# Patient Record
Sex: Male | Born: 1955 | Race: White | Hispanic: No | Marital: Married | Smoking: Never smoker
Health system: Southern US, Community
[De-identification: ages and names within clinical notes are randomized; demographics above are authoritative.]

## PROBLEM LIST (undated history)

## (undated) DIAGNOSIS — R519 Headache, unspecified: Secondary | ICD-10-CM

## (undated) DIAGNOSIS — D649 Anemia, unspecified: Secondary | ICD-10-CM

## (undated) DIAGNOSIS — G709 Myoneural disorder, unspecified: Secondary | ICD-10-CM

## (undated) HISTORY — PX: KNEE ARTHROPLASTY: SHX992

## (undated) HISTORY — PX: TONSILLECTOMY: SUR1361

## (undated) HISTORY — PX: WISDOM TOOTH EXTRACTION: SHX21

---

## 2007-10-19 ENCOUNTER — Ambulatory Visit: Payer: Self-pay | Admitting: Gastroenterology

## 2015-07-24 ENCOUNTER — Other Ambulatory Visit: Payer: Self-pay | Admitting: Internal Medicine

## 2015-07-24 DIAGNOSIS — N5089 Other specified disorders of the male genital organs: Secondary | ICD-10-CM

## 2015-07-27 ENCOUNTER — Ambulatory Visit
Admission: RE | Admit: 2015-07-27 | Discharge: 2015-07-27 | Disposition: A | Discharge status: Home / Self Care | Payer: 59 | Source: Ambulatory Visit | Attending: Internal Medicine | Admitting: Internal Medicine

## 2015-07-27 DIAGNOSIS — N503 Cyst of epididymis: Secondary | ICD-10-CM | POA: Diagnosis not present

## 2015-07-27 DIAGNOSIS — N5089 Other specified disorders of the male genital organs: Secondary | ICD-10-CM | POA: Diagnosis present

## 2015-07-27 DIAGNOSIS — N433 Hydrocele, unspecified: Secondary | ICD-10-CM | POA: Insufficient documentation

## 2018-03-10 IMAGING — US US ART/VEN ABD/PELV/SCROTUM DOPPLER LTD
1 series · 14 of 25 positions shown · non-contrast
Comparison: No prior.

CLINICAL DATA: Right testicle swelling.

EXAM:
SCROTAL ULTRASOUND
DOPPLER ULTRASOUND OF THE TESTICLES
TECHNIQUE: Complete ultrasound examination of the testicles, epididymis, and
other scrotal structures was performed. Color and spectral Doppler
ultrasound were also utilized to evaluate blood flow to the
testicles.

[Series 1: us art/ven abd/pelv/scrotum doppler ltd · 0.06mm/px · 14 of 93 slices shown]
[im 1/93]
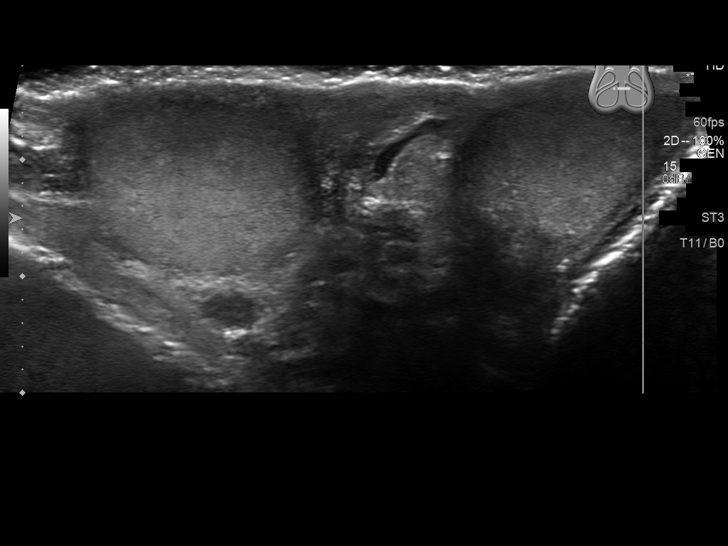
[im 8/93]
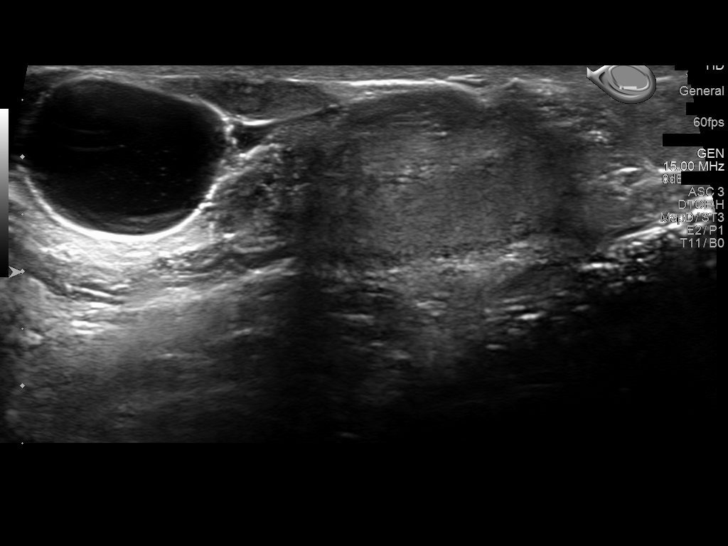
[im 16/93]
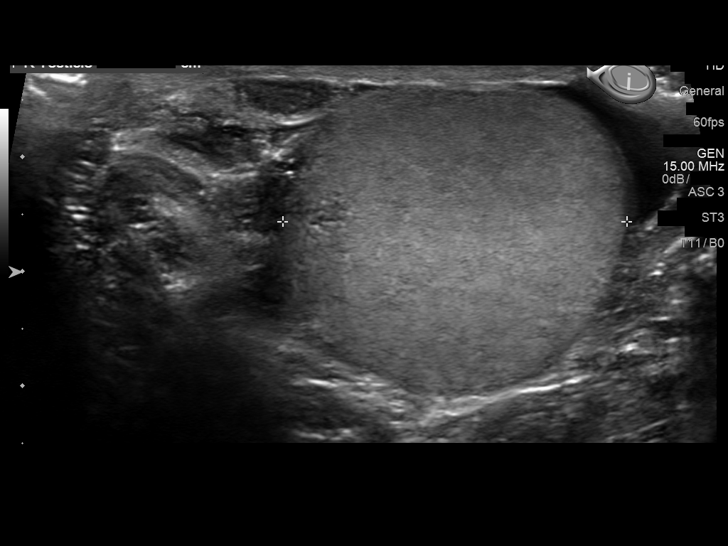
[im 24/93]
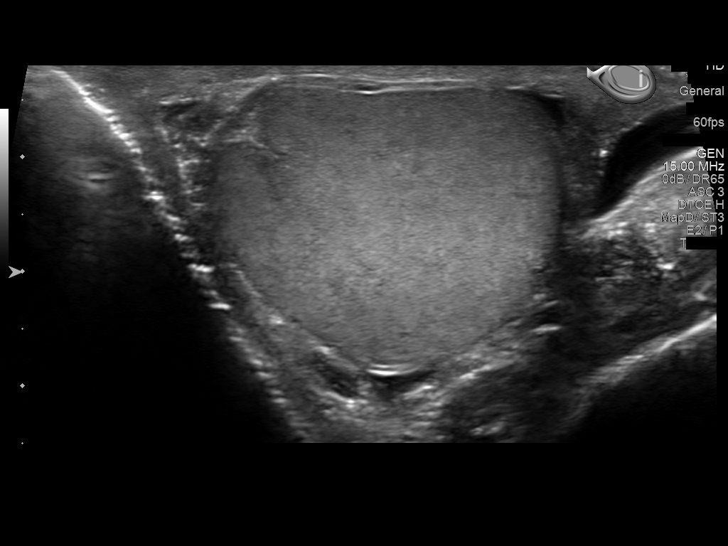
[im 31/93]
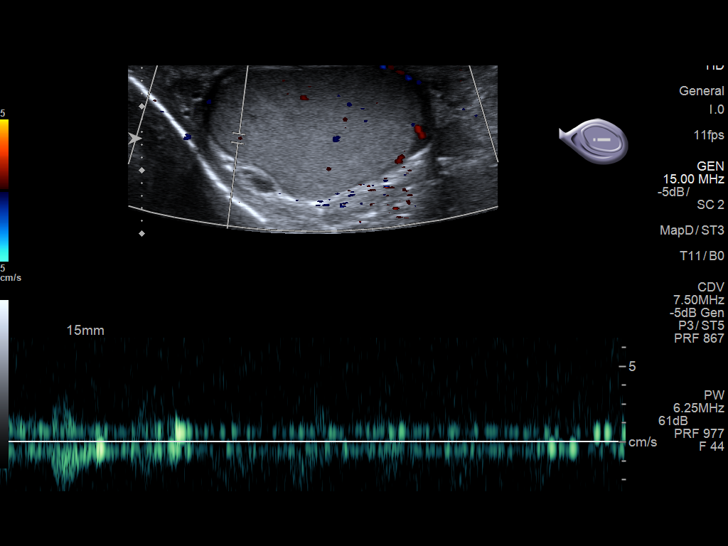
[im 35/93]
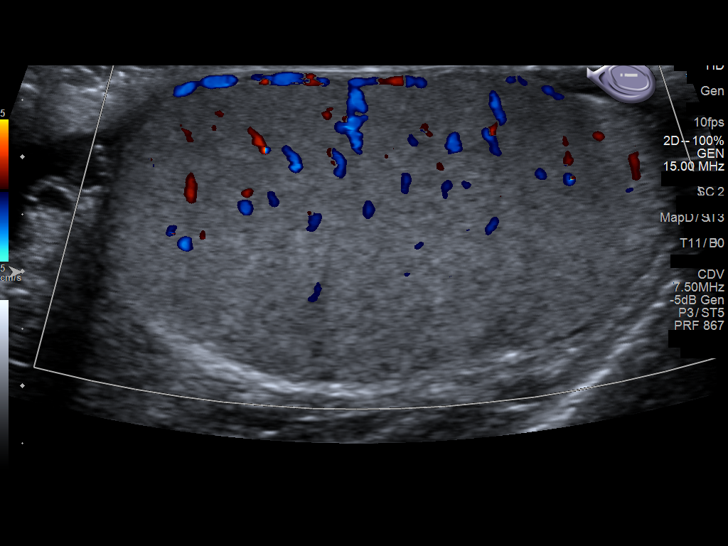
[im 43/93]
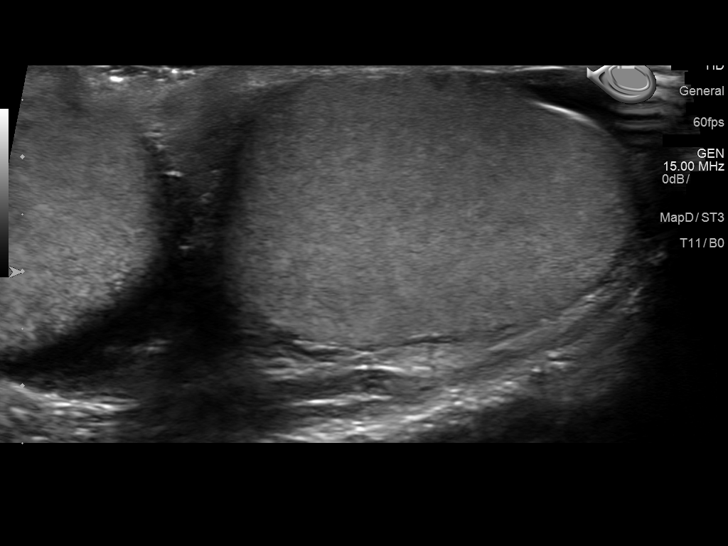
[im 50/93]
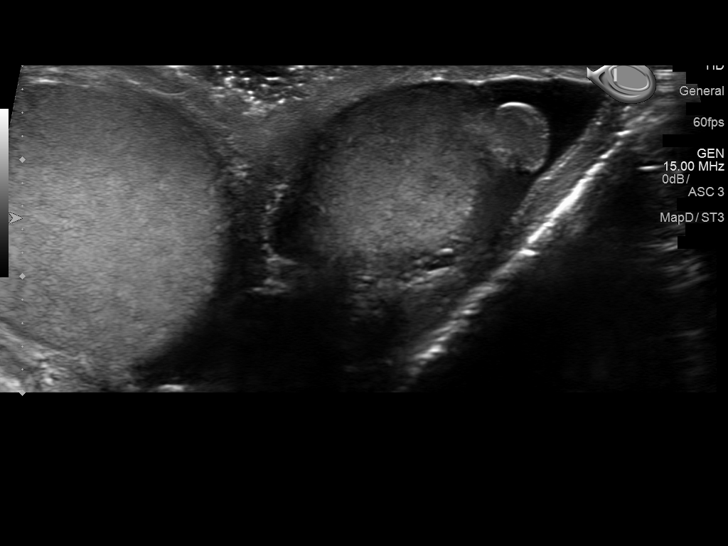
[im 58/93]
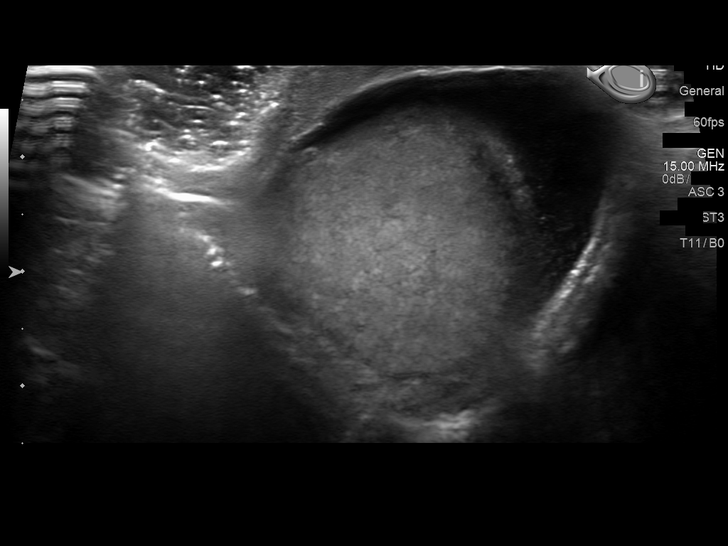
[im 62/93]
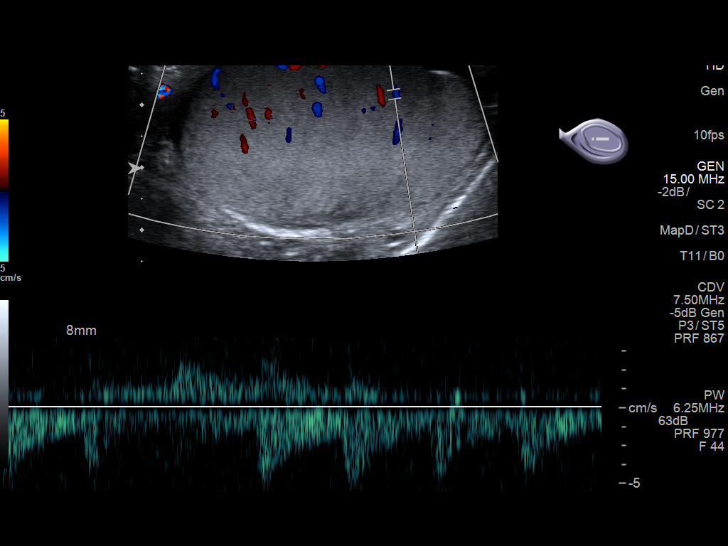
[im 70/93]
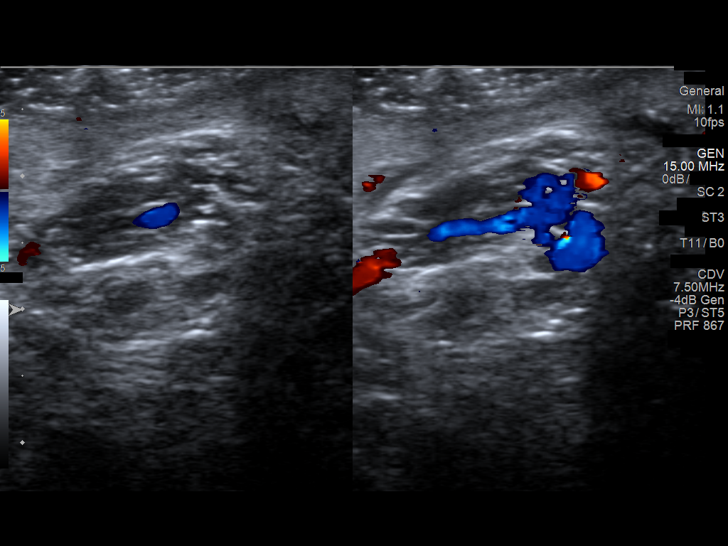
[im 77/93]
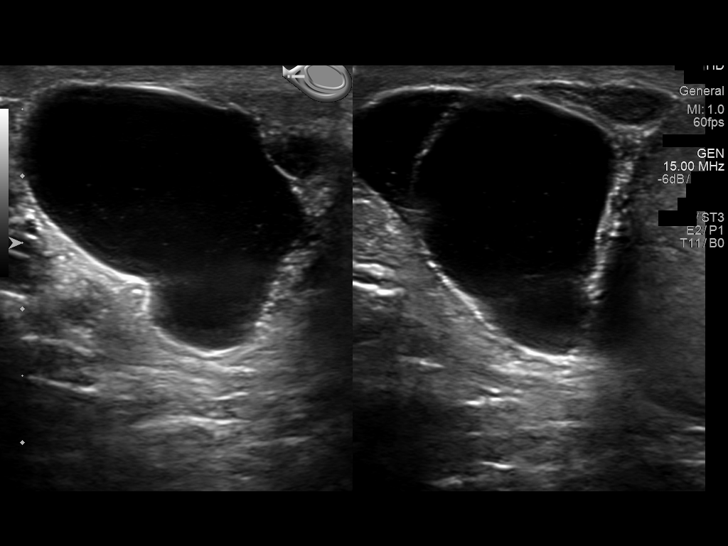
[im 85/93]
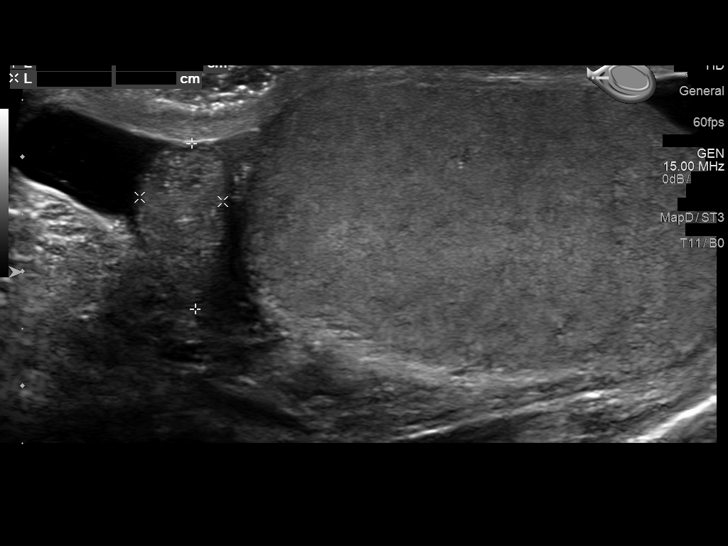
[im 93/93]
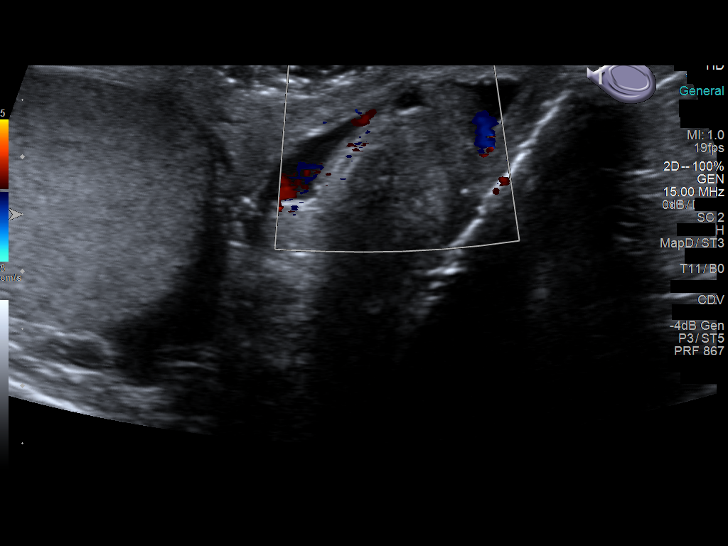

[14 of 25 positions shown; findings below may reference images not displayed]

FINDINGS: Right testicle

Measurements: 5.1 x 2.5 x 3.0 cm. No mass or microlithiasis
visualized.

Left testicle

Measurements: 5.0 x 2.7 x 3.1 cm. No mass or microlithiasis
visualized.

Right epididymis:  2.3 cm cyst.

Left epididymis:  3 mm cyst.

Hydrocele:  Small bilateral hydroceles.

Varicocele:  None visualized.

Pulsed Doppler interrogation of both testes demonstrates normal low
resistance arterial and venous waveforms bilaterally.
IMPRESSION: 2.3 cm right epididymal cyst. 3 mm left epididymal cyst. Small
bilateral hydroceles. Exam otherwise unremarkable.

## 2018-03-24 ENCOUNTER — Encounter
Admission: RE | Disposition: A | Discharge status: Home / Self Care | Payer: Self-pay | Source: Home / Self Care | Attending: Gastroenterology

## 2018-03-24 ENCOUNTER — Ambulatory Visit: Payer: BLUE CROSS/BLUE SHIELD | Admitting: Certified Registered"

## 2018-03-24 ENCOUNTER — Ambulatory Visit
Admission: RE | Admit: 2018-03-24 | Discharge: 2018-03-24 | Disposition: A | Discharge status: Home / Self Care | Payer: BLUE CROSS/BLUE SHIELD | Attending: Gastroenterology | Admitting: Gastroenterology

## 2018-03-24 DIAGNOSIS — Z1211 Encounter for screening for malignant neoplasm of colon: Secondary | ICD-10-CM | POA: Diagnosis not present

## 2018-03-24 DIAGNOSIS — K640 First degree hemorrhoids: Secondary | ICD-10-CM | POA: Insufficient documentation

## 2018-03-24 DIAGNOSIS — K644 Residual hemorrhoidal skin tags: Secondary | ICD-10-CM | POA: Insufficient documentation

## 2018-03-24 DIAGNOSIS — D125 Benign neoplasm of sigmoid colon: Secondary | ICD-10-CM | POA: Diagnosis not present

## 2018-03-24 DIAGNOSIS — Z791 Long term (current) use of non-steroidal anti-inflammatories (NSAID): Secondary | ICD-10-CM | POA: Insufficient documentation

## 2018-03-24 DIAGNOSIS — Z7982 Long term (current) use of aspirin: Secondary | ICD-10-CM | POA: Insufficient documentation

## 2018-03-24 DIAGNOSIS — Z79899 Other long term (current) drug therapy: Secondary | ICD-10-CM | POA: Insufficient documentation

## 2018-03-24 HISTORY — PX: COLONOSCOPY WITH PROPOFOL: SHX5780

## 2018-03-24 SURGERY — COLONOSCOPY WITH PROPOFOL
Anesthesia: General

## 2018-03-24 MED ORDER — PHENYLEPHRINE HCL 10 MG/ML IJ SOLN
INTRAMUSCULAR | Status: DC | PRN
  Administered 2018-03-24: 50 ug via INTRAVENOUS
  Administered 2018-03-24 (×2): 100 ug via INTRAVENOUS

## 2018-03-24 MED ORDER — PROPOFOL 500 MG/50ML IV EMUL
INTRAVENOUS | Status: DC | PRN
  Administered 2018-03-24: 125 ug/kg/min via INTRAVENOUS

## 2018-03-24 MED ORDER — PROPOFOL 10 MG/ML IV BOLUS
INTRAVENOUS | Status: AC
  Filled 2018-03-24: qty 20

## 2018-03-24 MED ORDER — SODIUM CHLORIDE 0.9 % IV SOLN
INTRAVENOUS | Status: DC
  Administered 2018-03-24: 1000 mL via INTRAVENOUS

## 2018-03-24 MED ORDER — PROPOFOL 500 MG/50ML IV EMUL
INTRAVENOUS | Status: AC
  Filled 2018-03-24: qty 50

## 2018-03-24 MED ORDER — PROPOFOL 10 MG/ML IV BOLUS
INTRAVENOUS | Status: DC | PRN
  Administered 2018-03-24: 50 mg via INTRAVENOUS
  Administered 2018-03-24: 30 mg via INTRAVENOUS

## 2018-03-24 MED ORDER — LIDOCAINE HCL (PF) 2 % IJ SOLN
INTRAMUSCULAR | Status: AC
  Filled 2018-03-24: qty 10

## 2018-03-24 MED ORDER — LIDOCAINE HCL (PF) 1 % IJ SOLN
INTRAMUSCULAR | Status: AC
  Administered 2018-03-24: 0.3 mL
  Filled 2018-03-24: qty 2

## 2018-03-24 MED ORDER — EPHEDRINE SULFATE 50 MG/ML IJ SOLN
INTRAMUSCULAR | Status: DC | PRN
  Administered 2018-03-24: 10 mg via INTRAVENOUS
  Administered 2018-03-24: 5 mg via INTRAVENOUS
  Administered 2018-03-24: 10 mg via INTRAVENOUS

## 2018-03-24 MED ORDER — EPHEDRINE SULFATE 50 MG/ML IJ SOLN
INTRAMUSCULAR | Status: AC
  Filled 2018-03-24: qty 1

## 2018-03-24 NOTE — Transfer of Care (Signed)
Immediate Anesthesia Transfer of Care Note  Patient: Jack Duffy  Procedure(s) Performed: COLONOSCOPY WITH PROPOFOL (N/A )  Patient Location: Endoscopy Unit  Anesthesia Type:General  Level of Consciousness: awake  Airway & Oxygen Therapy: Patient Spontanous Breathing and Patient connected to nasal cannula oxygen  Post-op Assessment: Report given to RN and Post -op Vital signs reviewed and stable  Post vital signs: stable  Last Vitals:  Vitals Value Taken Time  BP 108/63 03/24/2018 12:32 PM  Temp    Pulse 80 03/24/2018 12:32 PM  Resp 19 03/24/2018 12:32 PM  SpO2 100 % 03/24/2018 12:32 PM    Last Pain:  Vitals:   03/24/18 1230  TempSrc: (P) Tympanic  PainSc:          Complications: No apparent anesthesia complications

## 2018-03-24 NOTE — Anesthesia Preprocedure Evaluation (Signed)
Anesthesia Evaluation  Patient identified by MRN, date of birth, ID band Patient awake    Reviewed: Allergy & Precautions, NPO status , Patient's Chart, lab work & pertinent test results  History of Anesthesia Complications Negative for: history of anesthetic complications  Airway Mallampati: II  TM Distance: >3 FB Neck ROM: Full    Dental no notable dental hx.    Pulmonary neg pulmonary ROS, neg sleep apnea, neg COPD,    breath sounds clear to auscultation- rhonchi (-) wheezing      Cardiovascular Exercise Tolerance: Good (-) hypertension(-) CAD, (-) Past MI, (-) Cardiac Stents and (-) CABG  Rhythm:Regular Rate:Normal - Systolic murmurs and - Diastolic murmurs    Neuro/Psych neg Seizures negative neurological ROS  negative psych ROS   GI/Hepatic negative GI ROS, Neg liver ROS,   Endo/Other  negative endocrine ROS  Renal/GU negative Renal ROS     Musculoskeletal negative musculoskeletal ROS (+)   Abdominal (+) - obese,   Peds  Hematology negative hematology ROS (+)   Anesthesia Other Findings    Reproductive/Obstetrics                             Anesthesia Physical Anesthesia Plan  ASA: I  Anesthesia Plan: General   Post-op Pain Management:    Induction: Intravenous  PONV Risk Score and Plan: 1 and Propofol infusion  Airway Management Planned: Natural Airway  Additional Equipment:   Intra-op Plan:   Post-operative Plan:   Informed Consent: I have reviewed the patients History and Physical, chart, labs and discussed the procedure including the risks, benefits and alternatives for the proposed anesthesia with the patient or authorized representative who has indicated his/her understanding and acceptance.     Dental advisory given  Plan Discussed with: CRNA and Anesthesiologist  Anesthesia Plan Comments:         Anesthesia Quick Evaluation

## 2018-03-24 NOTE — Anesthesia Post-op Follow-up Note (Signed)
Anesthesia QCDR form completed.        

## 2018-03-24 NOTE — Anesthesia Postprocedure Evaluation (Signed)
Anesthesia Post Note  Patient: Jack Duffy  Procedure(s) Performed: COLONOSCOPY WITH PROPOFOL (N/A )  Patient location during evaluation: Endoscopy Anesthesia Type: General Level of consciousness: awake and alert and oriented Pain management: pain level controlled Vital Signs Assessment: post-procedure vital signs reviewed and stable Respiratory status: spontaneous breathing, nonlabored ventilation and respiratory function stable Cardiovascular status: blood pressure returned to baseline and stable Postop Assessment: no signs of nausea or vomiting Anesthetic complications: no     Last Vitals:  Vitals:   03/24/18 1250 03/24/18 1252  BP: (!) 120/59 (!) 120/59  Pulse: 69 68  Resp: 20 12  Temp:    SpO2: 99% 100%    Last Pain:  Vitals:   03/24/18 1230  TempSrc: Tympanic  PainSc:                  [place] Jeff

## 2018-03-24 NOTE — H&P (Signed)
Outpatient short stay form Pre-procedure 03/24/2018 11:45 AM Lollie Sails MD  Primary Physician: Dr Tracie Harrier  Reason for visit: Colonoscopy  History of present illness: Patient is a 63 year old male presenting today for colonoscopy in regards colon cancer screening.  His last colonoscopy was 10/19/2007.  There were no polyps at that time.  Tolerated his prep well.  He takes no aspirin or blood thinning agent.  Is no problems with abdominal pain diarrhea or blood in the stool.    Current Facility-Administered Medications:  .  0.9 %  sodium chloride infusion, , Intravenous, Continuous, Lollie Sails, MD, Last Rate: 20 mL/hr at 03/24/18 1129, 1,000 mL at 03/24/18 1129  Medications Prior to Admission  Medication Sig Dispense Refill Last Dose  . aspirin EC 81 MG tablet Take 81 mg by mouth daily.   03/19/2018  . gabapentin (NEURONTIN) 300 MG capsule Take 300 mg by mouth at bedtime.     . Multiple Vitamin (MULTIVITAMIN) tablet Take 1 tablet by mouth daily.     . naproxen (NAPROSYN) 500 MG tablet Take 500 mg by mouth 2 (two) times daily with a meal.     . omeprazole (PRILOSEC) 20 MG capsule Take 20 mg by mouth daily.        No Known Allergies   No past medical history on file.  Review of systems:      Physical Exam    Heart and lungs: Regular rate and rhythm without rub or gallop, lungs are bilaterally clear.    HEENT: Normocephalic atraumatic eyes are anicteric    Other:    Pertinant exam for procedure: Soft nontender nondistended bowel sounds positive normoactive    Planned proceedures: Colonoscopy and indicated procedures I have discussed the risks benefits and complications of procedures to include not limited to bleeding, infection, perforation and the risk of sedation and the patient wishes to proceed.    Lollie Sails, MD Gastroenterology 03/24/2018  11:45 AM

## 2018-03-24 NOTE — Op Note (Addendum)
[facility] Patient Name: Jack Duffy Procedure Date: 03/24/2018 11:42 AM MRN: [id number] Account #: [id number] Date of Birth: March 09, 1955 Admit Type: Outpatient Age: 63 Room: [facility] ENDO ROOM 3 Gender: Male Note Status: Finalized Procedure:            Colonoscopy Indications:          Screening for colorectal malignant neoplasm Providers:            Lollie Sails, MD Referring MD:         Tracie Harrier, MD (Referring MD) Medicines:            Monitored Anesthesia Care Complications:        No immediate complications. Procedure:            Pre-Anesthesia Assessment:                       - ASA Grade Assessment: I - A normal, healthy patient.                       After obtaining informed consent, the colonoscope was                        passed under direct vision. Throughout the procedure,                        the patient's blood pressure, pulse, and oxygen                        saturations were monitored continuously. The                        Colonoscope was introduced through the anus and                        advanced to the the cecum, identified by appendiceal                        orifice and ileocecal valve. The colonoscopy was                        performed without difficulty. The patient tolerated the                        procedure well. The quality of the bowel preparation                        was good. Findings:      A 6 mm polyp was found in the distal sigmoid colon. The polyp was       pedunculated. The polyp was removed with a hot snare. Resection and       retrieval were complete.      A diffuse area of mildly erythematous mucosa was found in the distal       rectum. This was biopsied with a cold forceps for histology.      Non-bleeding external and internal hemorrhoids were found during       retroflexion and during anoscopy. The hemorrhoids were medium-sized and       Grade I (internal hemorrhoids that do not  prolapse), with some evidence       of possible recent bleeding.  The exam was otherwise without abnormality.      The digital rectal exam was normal otherwise. Impression:           - One 6 mm polyp in the distal sigmoid colon, removed                        with a hot snare. Resected and retrieved.                       - Erythematous mucosa in the distal rectum. Biopsied.                       - Non-bleeding external and internal hemorrhoids.                       - The examination was otherwise normal. Recommendation:       - Discharge patient to home.                       - Await pathology results. Procedure Code(s):    --- Professional ---                       [phone], Colonoscopy, flexible; with removal of tumor(s),                        polyp(s), or other lesion(s) by snare technique                       45380, 21, Colonoscopy, flexible; with biopsy, single                        or multiple Diagnosis Code(s):    --- Professional ---                       Z12.11, Encounter for screening for malignant neoplasm                        of colon                       D12.5, Benign neoplasm of sigmoid colon                       K62.89, Other specified diseases of anus and rectum                       K64.0, First degree hemorrhoids CPT copyright 2018 American Medical Association. All rights reserved. The codes documented in this report are preliminary and upon coder review may  be revised to meet current compliance requirements. Lollie Sails, MD 03/24/2018 12:31:00 PM This report has been signed electronically. Number of Addenda: 0 Note Initiated On: 03/24/2018 11:42 AM Scope Withdrawal Time: 0 hours 18 minutes 49 seconds  Total Procedure Duration: 0 hours 27 minutes 6 seconds       [facility]

## 2018-03-25 ENCOUNTER — Encounter: Payer: Self-pay | Admitting: Gastroenterology

## 2018-03-25 LAB — SURGICAL PATHOLOGY

## 2019-07-17 ENCOUNTER — Other Ambulatory Visit: Payer: Self-pay

## 2019-07-17 ENCOUNTER — Emergency Department
Admission: EM | Admit: 2019-07-17 | Discharge: 2019-07-17 | Disposition: A | Discharge status: Home / Self Care | Payer: BC Managed Care – PPO | Attending: Emergency Medicine | Admitting: Emergency Medicine

## 2019-07-17 DIAGNOSIS — Z5321 Procedure and treatment not carried out due to patient leaving prior to being seen by health care provider: Secondary | ICD-10-CM | POA: Insufficient documentation

## 2019-07-17 DIAGNOSIS — R197 Diarrhea, unspecified: Secondary | ICD-10-CM | POA: Insufficient documentation

## 2019-07-17 DIAGNOSIS — R112 Nausea with vomiting, unspecified: Secondary | ICD-10-CM | POA: Insufficient documentation

## 2019-07-17 DIAGNOSIS — R109 Unspecified abdominal pain: Secondary | ICD-10-CM | POA: Diagnosis present

## 2019-07-17 LAB — URINALYSIS, COMPLETE (UACMP) WITH MICROSCOPIC
Bacteria, UA: NONE SEEN
Bilirubin Urine: NEGATIVE
Glucose, UA: NEGATIVE mg/dL
Hgb urine dipstick: NEGATIVE
Ketones, ur: NEGATIVE mg/dL
Leukocytes,Ua: NEGATIVE
Nitrite: NEGATIVE
Protein, ur: NEGATIVE mg/dL
Specific Gravity, Urine: 1.017 (ref 1.005–1.030)
Squamous Epithelial / HPF: NONE SEEN (ref 0–5)
pH: 5 (ref 5.0–8.0)

## 2019-07-17 LAB — COMPREHENSIVE METABOLIC PANEL
ALT: 20 U/L (ref 0–44)
AST: 24 U/L (ref 15–41)
Albumin: 4.8 g/dL (ref 3.5–5.0)
Alkaline Phosphatase: 75 U/L (ref 38–126)
Anion gap: 10 (ref 5–15)
BUN: 12 mg/dL (ref 8–23)
CO2: 27 mmol/L (ref 22–32)
Calcium: 9.7 mg/dL (ref 8.9–10.3)
Chloride: 102 mmol/L (ref 98–111)
Creatinine, Ser: 0.98 mg/dL (ref 0.61–1.24)
GFR calc Af Amer: >60 mL/min (ref >60)
GFR calc non Af Amer: >60 mL/min (ref >60)
Glucose, Bld: 106 mg/dL — ABNORMAL HIGH (ref 70–99)
Potassium: 4.4 mmol/L (ref 3.5–5.1)
Sodium: 139 mmol/L (ref 135–145)
Total Bilirubin: 1.2 mg/dL (ref 0.3–1.2)
Total Protein: 7 g/dL (ref 6.5–8.1)

## 2019-07-17 LAB — CBC
HCT: 43.3 % (ref 39.0–52.0)
Hemoglobin: 15.1 g/dL (ref 13.0–17.0)
MCH: 29.3 pg (ref 26.0–34.0)
MCHC: 34.9 g/dL (ref 30.0–36.0)
MCV: 83.9 fL (ref 80.0–100.0)
Platelets: 339 10*3/uL (ref 150–400)
RBC: 5.16 MIL/uL (ref 4.22–5.81)
RDW: 12.8 % (ref 11.5–15.5)
WBC: 11 10*3/uL — ABNORMAL HIGH (ref 4.0–10.5)
nRBC: 0 % (ref 0.0–0.2)

## 2019-07-17 LAB — LIPASE, BLOOD: Lipase: 29 U/L (ref 11–51)

## 2019-07-17 NOTE — ED Triage Notes (Signed)
Patient c/o abdominal pain, N/V/D for several months that has increased in severity today.

## 2019-07-29 ENCOUNTER — Other Ambulatory Visit: Payer: Self-pay | Admitting: Internal Medicine

## 2019-07-29 DIAGNOSIS — R1010 Upper abdominal pain, unspecified: Secondary | ICD-10-CM

## 2019-07-29 DIAGNOSIS — R634 Abnormal weight loss: Secondary | ICD-10-CM

## 2019-08-06 ENCOUNTER — Ambulatory Visit
Admission: RE | Admit: 2019-08-06 | Discharge: 2019-08-06 | Disposition: A | Discharge status: Home / Self Care | Payer: BC Managed Care – PPO | Source: Ambulatory Visit | Attending: Internal Medicine | Admitting: Internal Medicine

## 2019-08-06 ENCOUNTER — Other Ambulatory Visit: Payer: Self-pay

## 2019-08-06 ENCOUNTER — Encounter (INDEPENDENT_AMBULATORY_CARE_PROVIDER_SITE_OTHER): Payer: Self-pay

## 2019-08-06 DIAGNOSIS — R634 Abnormal weight loss: Secondary | ICD-10-CM | POA: Insufficient documentation

## 2019-08-06 DIAGNOSIS — R1010 Upper abdominal pain, unspecified: Secondary | ICD-10-CM | POA: Insufficient documentation

## 2019-08-06 MED ORDER — IOHEXOL 300 MG/ML  SOLN
100.0000 mL | Freq: Once | INTRAMUSCULAR | Status: AC | PRN
  Administered 2019-08-06: 100 mL via INTRAVENOUS

## 2021-01-04 ENCOUNTER — Other Ambulatory Visit: Payer: Self-pay | Admitting: Internal Medicine

## 2021-01-04 DIAGNOSIS — M792 Neuralgia and neuritis, unspecified: Secondary | ICD-10-CM

## 2021-01-04 DIAGNOSIS — G8929 Other chronic pain: Secondary | ICD-10-CM

## 2021-01-16 ENCOUNTER — Ambulatory Visit
Admission: RE | Admit: 2021-01-16 | Discharge: 2021-01-16 | Disposition: A | Discharge status: Home / Self Care | Payer: BC Managed Care – PPO | Source: Ambulatory Visit | Attending: Internal Medicine | Admitting: Internal Medicine

## 2021-01-16 DIAGNOSIS — M792 Neuralgia and neuritis, unspecified: Secondary | ICD-10-CM

## 2021-01-16 DIAGNOSIS — G8929 Other chronic pain: Secondary | ICD-10-CM

## 2021-01-16 MED ORDER — GADOBENATE DIMEGLUMINE 529 MG/ML IV SOLN
15.0000 mL | Freq: Once | INTRAVENOUS | Status: AC | PRN
  Administered 2021-01-16: 15 mL via INTRAVENOUS

## 2021-06-27 ENCOUNTER — Other Ambulatory Visit: Payer: Self-pay | Admitting: Neurosurgery

## 2021-06-27 DIAGNOSIS — Z01818 Encounter for other preprocedural examination: Secondary | ICD-10-CM

## 2021-07-05 ENCOUNTER — Other Ambulatory Visit: Payer: Self-pay

## 2021-07-05 ENCOUNTER — Encounter
Admission: RE | Admit: 2021-07-05 | Discharge: 2021-07-05 | Disposition: A | Discharge status: Home / Self Care | Payer: 59 | Source: Ambulatory Visit | Attending: Neurosurgery | Admitting: Neurosurgery

## 2021-07-05 DIAGNOSIS — Z01818 Encounter for other preprocedural examination: Secondary | ICD-10-CM | POA: Insufficient documentation

## 2021-07-05 DIAGNOSIS — Z01812 Encounter for preprocedural laboratory examination: Secondary | ICD-10-CM

## 2021-07-05 HISTORY — DX: Anemia, unspecified: D64.9

## 2021-07-05 HISTORY — DX: Headache, unspecified: R51.9

## 2021-07-05 HISTORY — DX: Myoneural disorder, unspecified: G70.9

## 2021-07-05 LAB — CBC
HCT: 42.4 % (ref 39.0–52.0)
Hemoglobin: 14.8 g/dL (ref 13.0–17.0)
MCH: 29.2 pg (ref 26.0–34.0)
MCHC: 34.9 g/dL (ref 30.0–36.0)
MCV: 83.8 fL (ref 80.0–100.0)
Platelets: 375 10*3/uL (ref 150–400)
RBC: 5.06 MIL/uL (ref 4.22–5.81)
RDW: 12.6 % (ref 11.5–15.5)
WBC: 6 10*3/uL (ref 4.0–10.5)
nRBC: 0 % (ref 0.0–0.2)

## 2021-07-05 LAB — URINALYSIS, ROUTINE W REFLEX MICROSCOPIC
Bilirubin Urine: NEGATIVE
Glucose, UA: NEGATIVE mg/dL
Hgb urine dipstick: NEGATIVE
Ketones, ur: NEGATIVE mg/dL
Leukocytes,Ua: NEGATIVE
Nitrite: NEGATIVE
Protein, ur: NEGATIVE mg/dL
Specific Gravity, Urine: 1.008 (ref 1.005–1.030)
pH: 7 (ref 5.0–8.0)

## 2021-07-05 LAB — COMPREHENSIVE METABOLIC PANEL
ALT: 22 U/L (ref 0–44)
AST: 26 U/L (ref 15–41)
Albumin: 4.6 g/dL (ref 3.5–5.0)
Alkaline Phosphatase: 77 U/L (ref 38–126)
Anion gap: 8 (ref 5–15)
BUN: 14 mg/dL (ref 8–23)
CO2: 28 mmol/L (ref 22–32)
Calcium: 9.4 mg/dL (ref 8.9–10.3)
Chloride: 105 mmol/L (ref 98–111)
Creatinine, Ser: 0.85 mg/dL (ref 0.61–1.24)
GFR, Estimated: >60 mL/min (ref >60)
Glucose, Bld: 81 mg/dL (ref 70–99)
Potassium: 4.1 mmol/L (ref 3.5–5.1)
Sodium: 141 mmol/L (ref 135–145)
Total Bilirubin: 0.8 mg/dL (ref 0.3–1.2)
Total Protein: 6.8 g/dL (ref 6.5–8.1)

## 2021-07-05 LAB — TYPE AND SCREEN
ABO/RH(D): A POS
Antibody Screen: NEGATIVE

## 2021-07-05 LAB — SURGICAL PCR SCREEN
MRSA, PCR: NEGATIVE
Staphylococcus aureus: NEGATIVE

## 2021-07-05 NOTE — Patient Instructions (Addendum)
Your procedure is scheduled on: 07/16/21 - Monday Report to the Registration Desk on the 1st floor of the [facility]. To find out your arrival time, please call [phone] between 1PM - 3PM on: 07/13/21 - Friday If your arrival time is 6:00 am, do not arrive prior to that time as the [facility] entrance doors do not open until 6:00 am.  REMEMBER: Instructions that are not followed completely may result in serious medical risk, up to and including death; or upon the discretion of your surgeon and anesthesiologist your surgery may need to be rescheduled.  Do not eat food after midnight the night before surgery.  No gum chewing, lozengers or hard candies.  You may however, drink CLEAR liquids up to 2 hours before you are scheduled to arrive for your surgery. Do not drink anything within 2 hours of your scheduled arrival time.  Clear liquids include: - water  - apple juice without pulp - gatorade (not RED colors) - black coffee or tea (Do NOT add milk or creamers to the coffee or tea) Do NOT drink anything that is not on this list.  TAKE THESE MEDICATIONS THE MORNING OF SURGERY WITH A SIP OF WATER:  - omeprazole (PRILOSEC) 20 MG capsule, (take one the night before and one on the morning of surgery - helps to prevent nausea after surgery.)  One week prior to surgery: stop taking 07/09/21. Stop Anti-inflammatories (NSAIDS) such as Advil, Aleve, Ibuprofen, Motrin, Naproxen, Naprosyn and Aspirin based products such as Excedrin, Goodys Powder, BC Powder.  Stop ANY OVER THE COUNTER supplements beginning 07/09/21 until after surgery.Multiple Vitamin , Ascorbic Acid (VITAMIN C) 1000 MG tablet, Cholecalciferol (VITAMIN D3) , Omega-3 Fatty Acids (FISH OIL) 1000 MG CAPS.  You may take Tylenol if needed for pain up until the day of surgery.  No Alcohol for 24 hours before or after surgery.  No Smoking including e-cigarettes for 24 hours prior to surgery.  No chewable tobacco products for at  least 6 hours prior to surgery.  No nicotine patches on the day of surgery.  Do not use any "recreational" drugs for at least a week prior to your surgery.  Please be advised that the combination of cocaine and anesthesia may have negative outcomes, up to and including death. If you test positive for cocaine, your surgery will be cancelled.  On the morning of surgery brush your teeth with toothpaste and water, you may rinse your mouth with mouthwash if you wish. Do not swallow any toothpaste or mouthwash.  Use CHG Soap or wipes as directed on instruction sheet.  Do not wear jewelry, make-up, hairpins, clips or nail polish.  Do not wear lotions, powders, or perfumes.   Do not shave body from the neck down 48 hours prior to surgery just in case you cut yourself which could leave a site for infection.  Also, freshly shaved skin may become irritated if using the CHG soap.  Contact lenses, hearing aids and dentures may not be worn into surgery.  Do not bring valuables to the hospital. [facility] is not responsible for any missing/lost belongings or valuables.   Notify your doctor if there is any change in your medical condition (cold, fever, infection).  Wear comfortable clothing (specific to your surgery type) to the hospital.  After surgery, you can help prevent lung complications by doing breathing exercises.  Take deep breaths and cough every 1-2 hours. Your doctor may order a device called an Incentive Spirometer to help you  take deep breaths. When coughing or sneezing, hold a pillow firmly against your incision with both hands. This is called "splinting." Doing this helps protect your incision. It also decreases belly discomfort.  If you are being admitted to the hospital overnight, leave your suitcase in the car. After surgery it may be brought to your room.  If you are being discharged the day of surgery, you will not be allowed to drive home. You will need a responsible adult  (18 years or older) to drive you home and stay with you that night.   If you are taking public transportation, you will need to have a responsible adult (18 years or older) with you. Please confirm with your physician that it is acceptable to use public transportation.   Please call the [facility] Dept. at [phone] if you have any questions about these instructions.  Surgery Visitation Policy:  Patients undergoing a surgery or procedure may have two family members or support persons with them as long as the person is not COVID-19 positive or experiencing its symptoms.   Inpatient Visitation:    Visiting hours are 7 a.m. to 8 p.m. Up to four visitors are allowed at one time in a patient room, including children. The visitors may rotate out with other people during the day. One designated support person (adult) may remain overnight.

## 2021-07-15 MED ORDER — CHLORHEXIDINE GLUCONATE 0.12 % MT SOLN
15.0000 mL | Freq: Once | OROMUCOSAL | Status: AC
  Administered 2021-07-16: 15 mL via OROMUCOSAL

## 2021-07-15 MED ORDER — ORAL CARE MOUTH RINSE: 15.0000 mL | Freq: Once | OROMUCOSAL | Status: AC

## 2021-07-15 MED ORDER — LACTATED RINGERS IV SOLN: INTRAVENOUS | Status: DC

## 2021-07-16 ENCOUNTER — Ambulatory Visit: Payer: 59

## 2021-07-16 ENCOUNTER — Encounter
Admission: RE | Disposition: A | Discharge status: Home / Self Care | Payer: Self-pay | Source: Home / Self Care | Attending: Neurosurgery

## 2021-07-16 ENCOUNTER — Ambulatory Visit: Payer: 59 | Admitting: Urgent Care

## 2021-07-16 ENCOUNTER — Ambulatory Visit
Admission: RE | Admit: 2021-07-16 | Discharge: 2021-07-16 | Disposition: A | Discharge status: Home / Self Care | Payer: 59 | Attending: Neurosurgery | Admitting: Neurosurgery

## 2021-07-16 ENCOUNTER — Other Ambulatory Visit: Payer: Self-pay

## 2021-07-16 ENCOUNTER — Ambulatory Visit: Payer: 59 | Admitting: Anesthesiology

## 2021-07-16 ENCOUNTER — Encounter: Payer: Self-pay | Admitting: Neurosurgery

## 2021-07-16 DIAGNOSIS — G629 Polyneuropathy, unspecified: Secondary | ICD-10-CM | POA: Insufficient documentation

## 2021-07-16 DIAGNOSIS — M5416 Radiculopathy, lumbar region: Secondary | ICD-10-CM | POA: Insufficient documentation

## 2021-07-16 DIAGNOSIS — D649 Anemia, unspecified: Secondary | ICD-10-CM | POA: Insufficient documentation

## 2021-07-16 DIAGNOSIS — M4807 Spinal stenosis, lumbosacral region: Secondary | ICD-10-CM | POA: Insufficient documentation

## 2021-07-16 DIAGNOSIS — K219 Gastro-esophageal reflux disease without esophagitis: Secondary | ICD-10-CM | POA: Diagnosis not present

## 2021-07-16 HISTORY — PX: LUMBAR LAMINECTOMY/DECOMPRESSION MICRODISCECTOMY: SHX5026

## 2021-07-16 LAB — ABO/RH: ABO/RH(D): A POS

## 2021-07-16 SURGERY — LUMBAR LAMINECTOMY/DECOMPRESSION MICRODISCECTOMY 2 LEVELS
Anesthesia: General | Site: Spine Lumbar | Laterality: Right

## 2021-07-16 MED ORDER — 0.9 % SODIUM CHLORIDE (POUR BTL) OPTIME
TOPICAL | Status: DC | PRN
  Administered 2021-07-16: 500 mL

## 2021-07-16 MED ORDER — OXYCODONE-ACETAMINOPHEN 5-325 MG PO TABS
1.0000 | ORAL_TABLET | Freq: Once | ORAL | Status: AC
  Administered 2021-07-16: 1 via ORAL

## 2021-07-16 MED ORDER — METHYLPREDNISOLONE ACETATE 40 MG/ML IJ SUSP
INTRAMUSCULAR | Status: AC
Start: 2021-07-16 — End: ?
  Filled 2021-07-16: qty 1

## 2021-07-16 MED ORDER — CEFAZOLIN SODIUM-DEXTROSE 2-4 GM/100ML-% IV SOLN
2.0000 g | INTRAVENOUS | Status: AC
  Administered 2021-07-16: 2 g via INTRAVENOUS

## 2021-07-16 MED ORDER — PROPOFOL 10 MG/ML IV BOLUS
INTRAVENOUS | Status: DC | PRN
  Administered 2021-07-16: 160 mg via INTRAVENOUS

## 2021-07-16 MED ORDER — ONDANSETRON HCL 4 MG/2ML IJ SOLN: 4.0000 mg | Freq: Once | INTRAMUSCULAR | Status: DC | PRN

## 2021-07-16 MED ORDER — ONDANSETRON HCL 4 MG/2ML IJ SOLN
INTRAMUSCULAR | Status: AC
  Filled 2021-07-16: qty 2

## 2021-07-16 MED ORDER — BUPIVACAINE HCL (PF) 0.5 % IJ SOLN
INTRAMUSCULAR | Status: AC
Start: 2021-07-16 — End: ?
  Filled 2021-07-16: qty 30

## 2021-07-16 MED ORDER — DEXMEDETOMIDINE (PRECEDEX) IN NS 20 MCG/5ML (4 MCG/ML) IV SYRINGE
PREFILLED_SYRINGE | INTRAVENOUS | Status: DC | PRN
  Administered 2021-07-16: 12 ug via INTRAVENOUS

## 2021-07-16 MED ORDER — LIDOCAINE HCL (CARDIAC) PF 100 MG/5ML IV SOSY
PREFILLED_SYRINGE | INTRAVENOUS | Status: DC | PRN
  Administered 2021-07-16: 100 mg via INTRAVENOUS

## 2021-07-16 MED ORDER — OXYCODONE-ACETAMINOPHEN 5-325 MG PO TABS
ORAL_TABLET | ORAL | Status: AC
  Filled 2021-07-16: qty 1

## 2021-07-16 MED ORDER — SUCCINYLCHOLINE CHLORIDE 200 MG/10ML IV SOSY
PREFILLED_SYRINGE | INTRAVENOUS | Status: DC | PRN
  Administered 2021-07-16: 100 mg via INTRAVENOUS

## 2021-07-16 MED ORDER — DEXAMETHASONE SODIUM PHOSPHATE 10 MG/ML IJ SOLN
INTRAMUSCULAR | Status: AC
  Filled 2021-07-16: qty 1

## 2021-07-16 MED ORDER — MIDAZOLAM HCL 2 MG/2ML IJ SOLN
INTRAMUSCULAR | Status: AC
  Filled 2021-07-16: qty 2

## 2021-07-16 MED ORDER — BUPIVACAINE-EPINEPHRINE (PF) 0.5% -1:200000 IJ SOLN
INTRAMUSCULAR | Status: AC
  Filled 2021-07-16: qty 30

## 2021-07-16 MED ORDER — FENTANYL CITRATE (PF) 100 MCG/2ML IJ SOLN
INTRAMUSCULAR | Status: AC
  Filled 2021-07-16: qty 2

## 2021-07-16 MED ORDER — SUCCINYLCHOLINE CHLORIDE 200 MG/10ML IV SOSY
PREFILLED_SYRINGE | INTRAVENOUS | Status: AC
  Filled 2021-07-16: qty 10

## 2021-07-16 MED ORDER — CHLORHEXIDINE GLUCONATE 0.12 % MT SOLN
OROMUCOSAL | Status: AC
  Filled 2021-07-16: qty 15

## 2021-07-16 MED ORDER — METHYLPREDNISOLONE ACETATE 40 MG/ML IJ SUSP
INTRAMUSCULAR | Status: DC | PRN
  Administered 2021-07-16: 40 mg

## 2021-07-16 MED ORDER — GLYCOPYRROLATE 0.2 MG/ML IJ SOLN
INTRAMUSCULAR | Status: DC | PRN
  Administered 2021-07-16: .2 mg via INTRAVENOUS

## 2021-07-16 MED ORDER — METHOCARBAMOL 500 MG PO TABS: 500.0000 mg | ORAL_TABLET | Freq: Four times a day (QID) | ORAL | 0 refills | Status: DC

## 2021-07-16 MED ORDER — DEXAMETHASONE SODIUM PHOSPHATE 10 MG/ML IJ SOLN
INTRAMUSCULAR | Status: DC | PRN
  Administered 2021-07-16: 10 mg via INTRAVENOUS

## 2021-07-16 MED ORDER — SURGIFLO WITH THROMBIN (HEMOSTATIC MATRIX KIT) OPTIME
TOPICAL | Status: DC | PRN
  Administered 2021-07-16: 1 via TOPICAL

## 2021-07-16 MED ORDER — SODIUM CHLORIDE (PF) 0.9 % IJ SOLN
INTRAMUSCULAR | Status: DC | PRN
  Administered 2021-07-16: 60 mL via INTRAMUSCULAR

## 2021-07-16 MED ORDER — OXYCODONE-ACETAMINOPHEN 5-325 MG PO TABS
1.0000 | ORAL_TABLET | ORAL | 0 refills | Status: AC | PRN
Start: 2021-07-16 — End: 2021-07-21

## 2021-07-16 MED ORDER — BUPIVACAINE LIPOSOME 1.3 % IJ SUSP
INTRAMUSCULAR | Status: AC
Start: 2021-07-16 — End: ?
  Filled 2021-07-16: qty 20

## 2021-07-16 MED ORDER — BUPIVACAINE-EPINEPHRINE (PF) 0.5% -1:200000 IJ SOLN
INTRAMUSCULAR | Status: DC | PRN
  Administered 2021-07-16: 3 mL

## 2021-07-16 MED ORDER — ROCURONIUM BROMIDE 100 MG/10ML IV SOLN
INTRAVENOUS | Status: DC | PRN
  Administered 2021-07-16: 5 mg via INTRAVENOUS

## 2021-07-16 MED ORDER — MIDAZOLAM HCL 2 MG/2ML IJ SOLN
INTRAMUSCULAR | Status: DC | PRN
  Administered 2021-07-16: 2 mg via INTRAVENOUS

## 2021-07-16 MED ORDER — SODIUM CHLORIDE FLUSH 0.9 % IV SOLN
INTRAVENOUS | Status: AC
  Filled 2021-07-16: qty 20

## 2021-07-16 MED ORDER — ONDANSETRON HCL 4 MG/2ML IJ SOLN
INTRAMUSCULAR | Status: DC | PRN
  Administered 2021-07-16: 4 mg via INTRAVENOUS

## 2021-07-16 MED ORDER — FENTANYL CITRATE (PF) 100 MCG/2ML IJ SOLN
INTRAMUSCULAR | Status: DC | PRN
  Administered 2021-07-16 (×2): 50 ug via INTRAVENOUS

## 2021-07-16 MED ORDER — GLYCOPYRROLATE 0.2 MG/ML IJ SOLN
INTRAMUSCULAR | Status: AC
Start: 2021-07-16 — End: ?
  Filled 2021-07-16: qty 1

## 2021-07-16 MED ORDER — LIDOCAINE HCL (PF) 2 % IJ SOLN
INTRAMUSCULAR | Status: AC
Start: 2021-07-16 — End: ?
  Filled 2021-07-16: qty 5

## 2021-07-16 MED ORDER — SENNA 8.6 MG PO TABS
1.0000 | ORAL_TABLET | Freq: Every day | ORAL | 0 refills | Status: DC | PRN
Start: 2021-07-16 — End: 2021-07-31

## 2021-07-16 MED ORDER — CEFAZOLIN SODIUM-DEXTROSE 2-4 GM/100ML-% IV SOLN
INTRAVENOUS | Status: AC
  Filled 2021-07-16: qty 100

## 2021-07-16 MED ORDER — PROPOFOL 10 MG/ML IV BOLUS
INTRAVENOUS | Status: AC
  Filled 2021-07-16: qty 40

## 2021-07-16 MED ORDER — FENTANYL CITRATE (PF) 100 MCG/2ML IJ SOLN: 25.0000 ug | INTRAMUSCULAR | Status: DC | PRN

## 2021-07-16 MED ORDER — ROCURONIUM BROMIDE 10 MG/ML (PF) SYRINGE
PREFILLED_SYRINGE | INTRAVENOUS | Status: AC
  Filled 2021-07-16: qty 10

## 2021-07-16 SURGICAL SUPPLY — 61 items
ADH SKN CLS APL DERMABOND .7 (GAUZE/BANDAGES/DRESSINGS) ×1
AGENT HMST KT MTR STRL THRMB (HEMOSTASIS) ×1
BASIN KIT SINGLE STR (MISCELLANEOUS) ×2 IMPLANT
BLADE CLIPPER SPEC (BLADE) ×1 IMPLANT
BUR NEURO DRILL SOFT 3.0X3.8M (BURR) ×2 IMPLANT
CNTNR SPEC 2.5X3XGRAD LEK (MISCELLANEOUS) ×1
CONT SPEC 4OZ STER OR WHT (MISCELLANEOUS) ×1
CONT SPEC 4OZ STRL OR WHT (MISCELLANEOUS) ×1
CONTAINER SPEC 2.5X3XGRAD LEK (MISCELLANEOUS) ×1 IMPLANT
COUNTER NEEDLE 20/40 LG (NEEDLE) ×1 IMPLANT
CUP MEDICINE 2OZ PLAST GRAD ST (MISCELLANEOUS) ×1 IMPLANT
DERMABOND ADVANCED (GAUZE/BANDAGES/DRESSINGS) ×1
DERMABOND ADVANCED .7 DNX12 (GAUZE/BANDAGES/DRESSINGS) ×1 IMPLANT
DRAPE C ARM PK CFD 31 SPINE (DRAPES) ×2 IMPLANT
DRAPE C-ARM 42X72 X-RAY (DRAPES) ×1 IMPLANT
DRAPE LAPAROTOMY 100X77 ABD (DRAPES) ×2 IMPLANT
DRAPE MICROSCOPE SPINE 48X150 (DRAPES) ×2 IMPLANT
DRAPE SURG 17X11 SM STRL (DRAPES) ×2 IMPLANT
DRSG OPSITE POSTOP 4X6 (GAUZE/BANDAGES/DRESSINGS) ×1 IMPLANT
ELECT CAUTERY BLADE TIP 2.5 (TIP)
ELECT EZSTD 165MM 6.5IN (MISCELLANEOUS) ×2
ELECT REM PT RETURN 9FT ADLT (ELECTROSURGICAL) ×2
ELECTRODE CAUTERY BLDE TIP 2.5 (TIP) ×1 IMPLANT
ELECTRODE EZSTD 165MM 6.5IN (MISCELLANEOUS) ×1 IMPLANT
ELECTRODE REM PT RTRN 9FT ADLT (ELECTROSURGICAL) ×1 IMPLANT
GLOVE BIOGEL PI IND STRL 6.5 (GLOVE) ×1 IMPLANT
GLOVE BIOGEL PI IND STRL 8.5 (GLOVE) ×1 IMPLANT
GLOVE BIOGEL PI INDICATOR 6.5 (GLOVE) ×1
GLOVE BIOGEL PI INDICATOR 8.5 (GLOVE) ×1
GLOVE SURG SYN 6.5 ES PF (GLOVE) ×2 IMPLANT
GLOVE SURG SYN 6.5 PF PI (GLOVE) ×1 IMPLANT
GLOVE SURG SYN 8.5  E (GLOVE) ×3
GLOVE SURG SYN 8.5 E (GLOVE) ×3 IMPLANT
GLOVE SURG SYN 8.5 PF PI (GLOVE) ×3 IMPLANT
GOWN SRG LRG LVL 4 IMPRV REINF (GOWNS) ×1 IMPLANT
GOWN SRG XL LVL 3 NONREINFORCE (GOWNS) ×1 IMPLANT
GOWN STRL NON-REIN TWL XL LVL3 (GOWNS) ×2
GOWN STRL REIN LRG LVL4 (GOWNS) ×2
GRADUATE 1200CC STRL 31836 (MISCELLANEOUS) ×1 IMPLANT
GRAFT DURAGEN MATRIX 1WX1L (Tissue) ×1 IMPLANT
KIT SPINAL PRONEVIEW (KITS) ×2 IMPLANT
MANIFOLD NEPTUNE II (INSTRUMENTS) ×2 IMPLANT
MARKER SKIN DUAL TIP RULER LAB (MISCELLANEOUS) ×3 IMPLANT
NDL HYPO 25X1 1.5 SAFETY (NEEDLE) IMPLANT
NDL SAFETY ECLIPSE 18X1.5 (NEEDLE) ×1 IMPLANT
NEEDLE HYPO 18GX1.5 SHARP (NEEDLE)
NEEDLE HYPO 25X1 1.5 SAFETY (NEEDLE) ×2 IMPLANT
NS IRRIG 500ML POUR BTL (IV SOLUTION) ×1 IMPLANT
PACK LAMINECTOMY NEURO (CUSTOM PROCEDURE TRAY) ×2 IMPLANT
PENCIL ELECTRO HAND CTR (MISCELLANEOUS) ×4 IMPLANT
SOLUTION IRRIG SURGIPHOR (IV SOLUTION) ×2 IMPLANT
SURGIFLO W/THROMBIN 8M KIT (HEMOSTASIS) ×2 IMPLANT
SUT DVC VLOC 3-0 CL 6 P-12 (SUTURE) ×2 IMPLANT
SUT VIC AB 0 CT1 27 (SUTURE) ×2
SUT VIC AB 0 CT1 27XCR 8 STRN (SUTURE) ×1 IMPLANT
SUT VIC AB 2-0 CT1 18 (SUTURE) ×2 IMPLANT
SYR 30ML LL (SYRINGE) ×4 IMPLANT
SYR 3ML LL SCALE MARK (SYRINGE) ×3 IMPLANT
TOWEL OR 17X26 4PK STRL BLUE (TOWEL DISPOSABLE) ×4 IMPLANT
TUBING CONNECTING 10 (TUBING) ×1 IMPLANT
WATER STERILE IRR 1000ML POUR (IV SOLUTION) ×5 IMPLANT

## 2021-07-16 NOTE — Progress Notes (Signed)
PAtient able to ambulate to and from the restroom without any issues

## 2021-07-16 NOTE — Anesthesia Preprocedure Evaluation (Signed)
Anesthesia Evaluation  Patient identified by MRN, date of birth, ID band Patient awake    Reviewed: Allergy & Precautions, H&P , NPO status , Patient's Chart, lab work & pertinent test results, reviewed documented beta blocker date and time   Airway Mallampati: II  TM Distance: >3 FB Neck ROM: full    Dental  (+) Teeth Intact   Pulmonary neg pulmonary ROS,    Pulmonary exam normal        Cardiovascular Exercise Tolerance: Good negative cardio ROS Normal cardiovascular exam Rhythm:regular Rate:Normal     Neuro/Psych  Headaches,  Neuromuscular disease negative psych ROS   GI/Hepatic negative GI ROS, Neg liver ROS,   Endo/Other  negative endocrine ROS  Renal/GU negative Renal ROS  negative genitourinary   Musculoskeletal   Abdominal   Peds  Hematology  (+) Blood dyscrasia, anemia ,   Anesthesia Other Findings Past Medical History: No date: Anemia No date: Headache     Comment:  visual distortion migraines No date: Neuromuscular disorder (HCC)     Comment:  neuropathy Past Surgical History: 03/24/2018: COLONOSCOPY WITH PROPOFOL; N/A     Comment:  Procedure: COLONOSCOPY WITH PROPOFOL;  Surgeon:               Lollie Sails, MD;  Location: [facility] ENDOSCOPY;                Service: Endoscopy;  Laterality: N/A; No date: KNEE ARTHROPLASTY     Comment:  when in college, was not a replacement No date: TONSILLECTOMY No date: WISDOM TOOTH EXTRACTION BMI    Body Mass Index: 25.09 kg/m     Reproductive/Obstetrics negative OB ROS                             Anesthesia Physical Anesthesia Plan  ASA: 2  Anesthesia Plan: General ETT   Post-op Pain Management:    Induction:   PONV Risk Score and Plan: 3  Airway Management Planned:   Additional Equipment:   Intra-op Plan:   Post-operative Plan:   Informed Consent: I have reviewed the patients History and Physical, chart, labs and  discussed the procedure including the risks, benefits and alternatives for the proposed anesthesia with the patient or authorized representative who has indicated his/her understanding and acceptance.     Dental Advisory Given  Plan Discussed with: CRNA  Anesthesia Plan Comments:         Anesthesia Quick Evaluation

## 2021-07-16 NOTE — Op Note (Addendum)
Indications: Jack Duffy is a 66 yo male who presents with lumbar radiculopathy.  He failed conservative management prompting surgical intervention.  Findings: compression of nerve roots  Preoperative Diagnosis: m54.16 lumbar radiculopathy Postoperative Diagnosis: same   EBL: 25 ml IVF: 750 ml Drains: none Disposition: Extubated and Stable to PACU Complications: none  No foley catheter was placed.   Preoperative Note:   Risks of surgery discussed include: infection, bleeding, stroke, coma, death, paralysis, CSF leak, nerve/spinal cord injury, numbness, tingling, weakness, complex regional pain syndrome, recurrent stenosis and/or disc herniation, vascular injury, development of instability, neck/back pain, need for further surgery, persistent symptoms, development of deformity, and the risks of anesthesia. The patient understood these risks and agreed to proceed.  Operative Note:   1. L4-S1 lumbar decompression including central laminectomy and bilateral medial facetectomies including foraminotomies  The patient was then brought from the preoperative center with intravenous access established.  The patient underwent general anesthesia and endotracheal tube intubation, and was then rotated on the [place] rail top where all pressure points were appropriately padded.  The skin was then thoroughly cleansed.  Perioperative antibiotic prophylaxis was administered.  Sterile prep and drapes were then applied and a timeout was then observed.  C-arm was brought into the field under sterile conditions and under lateral visualization the L5-S1 interspace was identified and marked.  The incision was marked on the right and injected with local anesthetic. Once this was complete a 4 cm incision was opened with the use of a #10 blade knife.    The metrx tubes were sequentially advanced and confirmed in position at L5-S1. An 2m by 421mtube was locked in place to the bed side attachment.  The microscope  was then sterilely brought into the field and muscle creep was hemostased with a bipolar and resected with a pituitary rongeur.  A Bovie extender was then used to expose the spinous process and lamina.  Careful attention was placed to not violate the facet capsule. A 3 mm matchstick drill bit was then used to make a hemi-laminotomy trough until the ligamentum flavum was exposed.  This was extended to the base of the spinous process and to the contralateral side to remove all the central bone from each side.  Once this was complete and the underlying ligamentum flavum was visualized, it was dissected with a curette and resected with Kerrison rongeurs.  Extensive ligamentum hypertrophy was noted, requiring a substantial amount of time and care for removal.  The dura was identified and palpated. The kerrison rongeur was then used to remove the medial facet bilaterally until no compression was noted.  A balltip probe was used to confirm decompression of the ipsilateral S1 nerve root.  No CSF leak was noted.  A Depo-Medrol soaked Gelfoam pledget was placed in the defect.  The wound was copiously irrigated. The tube system was then removed under microscopic visualization and hemostasis was obtained with a bipolar.    After performing the decompression at L5-S1, the metrx tubes were sequentially advanced and confirmed in position at L4-5. An 1844my 1m4mbe was locked in place to the bed side attachment.  Fluoroscopy was then removed from the field.  The microscope was then sterilely brought into the field and muscle creep was hemostased with a bipolar and resected with a pituitary rongeur.  A Bovie extender was then used to expose the spinous process and lamina.  Careful attention was placed to not violate the facet capsule. A 3 mm matchstick drill bit  was then used to make a hemi-laminotomy trough until the ligamentum flavum was exposed.  This was extended to the base of the spinous process and to the  contralateral side to remove all the central bone from each side.  Once this was complete and the underlying ligamentum flavum was visualized, it was dissected with a curette and resected with Kerrison rongeurs.  Extensive ligamentum hypertrophy was noted, requiring a substantial amount of time and care for removal.  The dura was identified and palpated. The kerrison rongeur was then used to remove the medial facet bilaterally until no compression was noted.  A balltip probe was used to confirm decompression of the ipsilateral L5 nerve root.  No CSF leak was noted.  A Depo-Medrol soaked Gelfoam pledget was placed in the defect.  The wound was copiously irrigated. The tube system was then removed under microscopic visualization and hemostasis was obtained with a bipolar.     The fascial layer was reapproximated with the use of a 0 Vicryl suture.  Subcutaneous tissue layer was reapproximated using 2-0 Vicryl suture.  3-0 monocryl was placed in subcuticular fashion. The skin was then cleansed and Dermabond was used to close the skin opening.  Patient was then rotated back to the preoperative bed awakened from anesthesia and taken to recovery all counts are correct in this case.  I performed the entire procedure with the assistance of Cooper Render PA as an Pensions consultant. An Pensions consultant was required for this case for safe and careful dissection of the soft tissues while manipulating and decompressing the neural elements.  Yvette Loveless K. Izora Ribas MD

## 2021-07-16 NOTE — Discharge Summary (Signed)
Physician Discharge Summary  Patient ID: Jack Duffy MRN: [id number] DOB/AGE: 64-11-1955 64 y.o.  Admit date: 07/16/2021 Discharge date: 07/16/2021  Admission Diagnoses: m54.16 lumbar radiculopathy  Discharge Diagnoses:  Active Problems:   * No active hospital problems. *   Discharged Condition: good  Hospital Course:  Jack Duffy is a 66 y.o s/p L4-S1 laminoforaminotomies. Jack Duffy intraoperative course was uncomplicated. He was monitored post-op in the PACU and discharged home after ambulating, urinating, and tolerating PO intake. He was given prescriptions for Percocet, Robaxin, and Senna  Consults: None  Significant Diagnostic Studies: none   Treatments: surgery: as above. Please see separately dictated operative report for further details.   Discharge Exam: Blood pressure 132/66, pulse 64, temperature 99 F (37.2 C), temperature source Oral, resp. rate 18, height 6' (1.829 m), weight 83.9 kg, SpO2 100 %. CN II-XII grossly intact 5/5 throughout BLE Incision c/d/i  Disposition: Discharge disposition: 01-Home or Self Care       Discharge Instructions     Remove dressing in 48 hours   Complete by: As directed       Allergies as of 07/16/2021       Reactions   Lamisil [terbinafine] Rash        Medication List     STOP taking these medications    acetaminophen 500 MG tablet Commonly known as: TYLENOL   aspirin EC 81 MG tablet   naproxen 500 MG tablet Commonly known as: NAPROSYN       TAKE these medications    clobetasol 0.05 % Gel Commonly known as: TEMOVATE Apply 1 Application topically as needed.   ferrous sulfate 325 (65 FE) MG tablet Take 325 mg by mouth daily with breakfast.   Fish Oil 1000 MG Caps Take 1 capsule by mouth daily.   gabapentin 300 MG capsule Commonly known as: NEURONTIN Take 300 mg by mouth at bedtime.   methocarbamol 500 MG tablet Commonly known as: ROBAXIN Take 1 tablet (500 mg total) by mouth 4 (four) times  daily.   multivitamin tablet Take 1 tablet by mouth daily.   omeprazole 20 MG capsule Commonly known as: PRILOSEC Take 20 mg by mouth daily.   oxyCODONE-acetaminophen 5-325 MG tablet Commonly known as: Percocet Take 1 tablet by mouth every 4 (four) hours as needed for up to 5 days for severe pain.   senna 8.6 MG Tabs tablet Commonly known as: SENOKOT Take 1 tablet (8.6 mg total) by mouth daily as needed for mild constipation.   vitamin C 1000 MG tablet Take 1,000 mg by mouth daily.   Vitamin D3 50 MCG (2000 UT) Tabs Take 1 tablet by mouth 3 (three) times a week.        Follow-up Information     Loleta Dicker, PA Follow up in 2 week(s).   Specialty: Neurosurgery Why: post-op follow up Contact information: [address] [place] [address] [phone]                 Signed: Loleta Dicker 07/16/2021, 9:21 AM

## 2021-07-16 NOTE — Discharge Instructions (Addendum)
Your surgeon has performed an operation on your lumbar spine (low back) to relieve pressure on one or more nerves. Many times, patients feel better immediately after surgery and can "overdo it." Even if you feel well, it is important that you follow these activity guidelines. If you do not let your back heal properly from the surgery, you can increase the chance of a disc herniation and/or return of your symptoms. The following are instructions to help in your recovery once you have been discharged from the hospital.   Activity    No bending, lifting, or twisting ("BLT"). Avoid lifting objects heavier than 10 pounds (gallon milk jug).  Where possible, avoid household activities that involve lifting, bending, pushing, or pulling such as laundry, vacuuming, grocery shopping, and childcare. Try to arrange for help from friends and family for these activities while your back heals.  Increase physical activity slowly as tolerated.  Taking short walks is encouraged, but avoid strenuous exercise. Do not jog, run, bicycle, lift weights, or participate in any other exercises unless specifically allowed by your doctor. Avoid prolonged sitting, including car rides.  Talk to your doctor before resuming sexual activity.  You should not drive until cleared by your doctor.  Until released by your doctor, you should not return to work or school.  You should rest at home and let your body heal.   You may shower two days after your surgery.  After showering, lightly dab your incision dry. Do not take a tub bath or go swimming for 3 weeks, or until approved by your doctor at your follow-up appointment.  If you smoke, we strongly recommend that you quit.  Smoking has been proven to interfere with normal healing in your back and will dramatically reduce the success rate of your surgery. Please contact [facility] (800-QUIT-NOW) and use the resources at www.[facility].com for assistance in stopping smoking.  Surgical  Incision   If you have a dressing on your incision, you may remove it two days after your surgery. Keep your incision area clean and dry.  Your incision was closed with Dermabond glue. The glue should begin to peel away within about a week. Diet            You may return to your usual diet. Be sure to stay hydrated.  When to Contact us  Although your surgery and recovery will likely be uneventful, you may have some residual numbness, aches, and pains in your back and/or legs. This is normal and should improve in the next few weeks.  However, should you experience any of the following, contact us immediately: New numbness or weakness Pain that is progressively getting worse, and is not relieved by your pain medications or rest Bleeding, redness, swelling, pain, or drainage from surgical incision Chills or flu-like symptoms Fever greater than 101.0 F (38.3 C) Problems with bowel or bladder functions Difficulty breathing or shortness of breath Warmth, tenderness, or swelling in your calf  Contact Information During office hours (Monday-Friday 9 am to 5 pm), please call your physician at [phone] After hours and weekends, please call [phone] and speak with the answering service, who will contact the doctor on call.  If that fails, call the [facility] Operator at [phone] and ask for the Neurosurgery Resident On Call  For a life-threatening emergency, call [place]   The drugs that you were given will stay in your system until tomorrow so for the next 24 hours you should not:  Drive an automobile Make any legal decisions Drink any alcoholic beverage   You may resume regular meals tomorrow.  Today it is better to start with liquids and gradually work up to solid foods.  You may eat anything you prefer, but it is better to start with liquids, then soup and crackers, and gradually work up to solid foods.   Please notify your doctor  immediately if you have any unusual bleeding, trouble breathing, redness and pain at the surgery site, drainage, fever, or pain not relieved by medication.    Additional Instructions:   PLEASE LEAVE GREEN ARMBAND ON FOR 4 DAYS    Please contact your physician with any problems or Same Day Surgery at [phone], Monday through Friday 6 am to 4 pm, or [place] [facility] number at [phone].

## 2021-07-16 NOTE — Anesthesia Postprocedure Evaluation (Signed)
Anesthesia Post Note  Patient: Jack Duffy  Procedure(s) Performed: RIGHT L4-S1 LAMINOFORAMINOTOMIES (Right: Spine Lumbar)  Patient location during evaluation: PACU Anesthesia Type: General Level of consciousness: awake and alert, oriented and patient cooperative Pain management: pain level controlled Vital Signs Assessment: post-procedure vital signs reviewed and stable Respiratory status: spontaneous breathing, nonlabored ventilation and respiratory function stable Cardiovascular status: blood pressure returned to baseline and stable Postop Assessment: adequate PO intake Anesthetic complications: no   No notable events documented.   Last Vitals:  Vitals:   07/16/21 1030 07/16/21 1045  BP: 131/76 129/75  Pulse: 67 72  Resp: 15 20  Temp: (!) 36.3 C (!) 36.1 C  SpO2: 96% 100%    Last Pain:  Vitals:   07/16/21 1045  TempSrc: Temporal  PainSc: 2                  Darrin Nipper

## 2021-07-16 NOTE — Progress Notes (Signed)
Pharmacy Antibiotic Note  Jack Duffy is a 66 y.o. male admitted on 07/16/2021 with surgical prophylaxis.  Pharmacy has been consulted for Cefazolin dosing.  Plan: TBW = 83.9 kg  Cefazolin 2 gm IV X 1 60 min pre-op ordered for 7/10 @ 0630.  Height: 6' (182.9 cm) Weight: 83.9 kg (185 lb) IBW/kg (Calculated) : 77.6  No data recorded.  No results for input(s): "WBC", "CREATININE", "LATICACIDVEN", "VANCOTROUGH", "VANCOPEAK", "VANCORANDOM", "GENTTROUGH", "GENTPEAK", "GENTRANDOM", "TOBRATROUGH", "TOBRAPEAK", "TOBRARND", "AMIKACINPEAK", "AMIKACINTROU", "AMIKACIN" in the last 168 hours.  Estimated Creatinine Clearance: 96.4 mL/min (by C-G formula based on SCr of 0.85 mg/dL).    Allergies  Allergen Reactions   Lamisil [Terbinafine] Rash    Antimicrobials this admission:   >>    >>   Dose adjustments this admission:   Microbiology results:  BCx:   UCx:    Sputum:    MRSA PCR:   Thank you for allowing pharmacy to be a part of this patient's care.  Saidee Geremia D 07/16/2021 6:26 AM

## 2021-07-16 NOTE — H&P (Signed)
I have reviewed and confirmed my history and physical from 06/26/2021 with no additions or changes. Plan for R L4-S1 laminoforaminotomies.  Risks and benefits reviewed.  Heart sounds normal no MRG. Chest Clear to Auscultation Bilaterally.

## 2021-07-16 NOTE — Transfer of Care (Signed)
Immediate Anesthesia Transfer of Care Note  Patient: Jack Duffy  Procedure(s) Performed: RIGHT L4-S1 LAMINOFORAMINOTOMIES (Right: Spine Lumbar)  Patient Location: PACU  Anesthesia Type:General  Level of Consciousness: sedated  Airway & Oxygen Therapy: Patient Spontanous Breathing and Patient connected to face mask oxygen  Post-op Assessment: Report given to RN and Post -op Vital signs reviewed and stable  Post vital signs: Reviewed  Last Vitals:  Vitals Value Taken Time  BP    Temp    Pulse 59 07/16/21 0928  Resp    SpO2 99 % 07/16/21 0928  Vitals shown include unvalidated device data.  Last Pain:      Patients Stated Pain Goal: 2 (58/09/98 3382)  Complications: No notable events documented.

## 2021-07-16 NOTE — Anesthesia Procedure Notes (Signed)
Procedure Name: Intubation Date/Time: 07/16/2021 7:39 AM  Performed by: Rolla Plate, CRNAPre-anesthesia Checklist: Patient identified, Patient being monitored, Timeout performed, Emergency Drugs available and Suction available Patient Re-evaluated:Patient Re-evaluated prior to induction Oxygen Delivery Method: Circle system utilized Preoxygenation: Pre-oxygenation with 100% oxygen Induction Type: IV induction Ventilation: Mask ventilation without difficulty Laryngoscope Size: McGraph and 4 Grade View: Grade I Tube type: Oral Tube size: 7.5 mm Number of attempts: 1 Airway Equipment and Method: Stylet and Video-laryngoscopy Placement Confirmation: ETT inserted through vocal cords under direct vision, positive ETCO2 and breath sounds checked- equal and bilateral Secured at: 23 cm Tube secured with: Tape Dental Injury: Teeth and Oropharynx as per pre-operative assessment

## 2021-07-20 ENCOUNTER — Encounter: Payer: Self-pay | Admitting: Neurosurgery

## 2021-07-31 ENCOUNTER — Encounter: Payer: Self-pay | Admitting: Neurosurgery

## 2021-07-31 ENCOUNTER — Ambulatory Visit (INDEPENDENT_AMBULATORY_CARE_PROVIDER_SITE_OTHER): Payer: 59 | Admitting: Neurosurgery

## 2021-07-31 VITALS — BP 137/72 | HR 63 | Temp 98.2°F | Ht 72.0 in

## 2021-07-31 DIAGNOSIS — Z09 Encounter for follow-up examination after completed treatment for conditions other than malignant neoplasm: Secondary | ICD-10-CM

## 2021-07-31 DIAGNOSIS — M5416 Radiculopathy, lumbar region: Secondary | ICD-10-CM

## 2021-07-31 DIAGNOSIS — Z9889 Other specified postprocedural states: Secondary | ICD-10-CM

## 2021-07-31 NOTE — Progress Notes (Signed)
   REFERRING PHYSICIAN:  Meade Maw, [address] [place],  [place] [address]  DOS: 07/16/21 right L4-S1 laminoforaminotomies  HISTORY OF PRESENT ILLNESS: Jack Duffy is approximately 2 weeks status post lumbar decompression. he is doing well postoperatively and very pleased with his postoperative outcome thus far.  He states he has had complete resolution of his preoperative right leg pain.  He denies any additional concerns.  PHYSICAL EXAMINATION:  General: Patient is well developed, well nourished, calm, collected, and in no apparent distress.   NEUROLOGICAL:  General: In no acute distress.   Awake, alert, oriented to person, place, and time.  Pupils equal round and reactive to light.  Facial tone is symmetric.    Strength:            Side Iliopsoas Quads Hamstring PF DF EHL  R '5 5 5 5 5 5  '$ L '5 5 5 5 5 5   '$ Incision c/d/I and well healed    ROS (Neurologic):  Negative except as noted above  IMAGING: No interval imaging to review  ASSESSMENT/PLAN:  Jack Duffy is doing well approximately 2 weeks after lumbar laminoforaminotomy's.  We discussed activity escalation and I have advised the patient to lift up to 10 pounds until 6 weeks after surgery, then increase up to 25 pounds until 12 weeks after surgery.  After 12 weeks post-op, the patient advised to increase activity as tolerated. he will follow up 4 weeks to see Dr. Izora Ribas.  He was encouraged to call the office in the interim should he have any questions or concerns.  He expressed understanding was in agreement with this plan.  Cooper Render PA-C Department of neurosurgery

## 2021-08-28 ENCOUNTER — Encounter: Payer: Self-pay | Admitting: Neurosurgery

## 2021-08-28 ENCOUNTER — Ambulatory Visit (INDEPENDENT_AMBULATORY_CARE_PROVIDER_SITE_OTHER): Payer: 59 | Admitting: Neurosurgery

## 2021-08-28 VITALS — BP 120/68 | Temp 98.1°F | Ht 72.0 in | Wt 182.4 lb

## 2021-08-28 DIAGNOSIS — Z09 Encounter for follow-up examination after completed treatment for conditions other than malignant neoplasm: Secondary | ICD-10-CM

## 2021-08-28 DIAGNOSIS — M48062 Spinal stenosis, lumbar region with neurogenic claudication: Secondary | ICD-10-CM

## 2021-08-28 NOTE — Progress Notes (Signed)
   REFERRING PHYSICIAN:  Meade Maw, [address] [place],  [place] [address]  DOS: 07/16/21 right L4-S1 laminoforaminotomies  HISTORY OF PRESENT ILLNESS: Jack Duffy is status post lumbar decompression.  He is doing well.  He had some worsening of his condition after his 2-week appointment but feels that he is back to his condition.  He has been doing much better than prior to surgery.  PHYSICAL EXAMINATION:  General: Patient is well developed, well nourished, calm, collected, and in no apparent distress.   NEUROLOGICAL:  General: In no acute distress.   Awake, alert, oriented to person, place, and time.  Pupils equal round and reactive to light.  Facial tone is symmetric.    Strength:            Side Iliopsoas Quads Hamstring PF DF EHL  R '5 5 5 5 5 5  '$ L '5 5 5 5 5 5   '$ Incision c/d/I and well healed    ROS (Neurologic):  Negative except as noted above  IMAGING: No interval imaging to review  ASSESSMENT/PLAN:  Jack Duffy is doing well after lumbar laminoforaminotomies.   He is doing reasonably well.  He will start slowly increasing his activity.  Meade Maw MD Department of neurosurgery

## 2021-09-01 ENCOUNTER — Emergency Department
Admission: EM | Admit: 2021-09-01 | Discharge: 2021-09-07 | Disposition: E | Payer: 59 | Attending: Emergency Medicine | Admitting: Emergency Medicine

## 2021-09-01 ENCOUNTER — Emergency Department: Payer: 59

## 2021-09-01 DIAGNOSIS — R7309 Other abnormal glucose: Secondary | ICD-10-CM | POA: Diagnosis not present

## 2021-09-01 DIAGNOSIS — I469 Cardiac arrest, cause unspecified: Secondary | ICD-10-CM

## 2021-09-01 LAB — CBC WITH DIFFERENTIAL/PLATELET
Abs Immature Granulocytes: 0.84 10*3/uL — ABNORMAL HIGH (ref 0.00–0.07)
Basophils Absolute: 0.1 10*3/uL (ref 0.0–0.1)
Basophils Relative: 1 %
Eosinophils Absolute: 0.1 10*3/uL (ref 0.0–0.5)
Eosinophils Relative: 1 %
HCT: 55.6 % — ABNORMAL HIGH (ref 39.0–52.0)
Hemoglobin: 18 g/dL — ABNORMAL HIGH (ref 13.0–17.0)
Immature Granulocytes: 7 %
Lymphocytes Relative: 47 %
Lymphs Abs: 5.3 10*3/uL — ABNORMAL HIGH (ref 0.7–4.0)
MCH: 29.4 pg (ref 26.0–34.0)
MCHC: 32.4 g/dL (ref 30.0–36.0)
MCV: 90.8 fL (ref 80.0–100.0)
Monocytes Absolute: 0.4 10*3/uL (ref 0.1–1.0)
Monocytes Relative: 3 %
Neutro Abs: 4.6 10*3/uL (ref 1.7–7.7)
Neutrophils Relative %: 41 %
Platelets: 144 10*3/uL — ABNORMAL LOW (ref 150–400)
RBC: 6.12 MIL/uL — ABNORMAL HIGH (ref 4.22–5.81)
RDW: 13.2 % (ref 11.5–15.5)
Smear Review: NORMAL
WBC: 11.4 10*3/uL — ABNORMAL HIGH (ref 4.0–10.5)
nRBC: 1 % — ABNORMAL HIGH (ref 0.0–0.2)

## 2021-09-01 LAB — COMPREHENSIVE METABOLIC PANEL
ALT: 109 U/L — ABNORMAL HIGH (ref 0–44)
AST: 150 U/L — ABNORMAL HIGH (ref 15–41)
Albumin: 3.4 g/dL — ABNORMAL LOW (ref 3.5–5.0)
Alkaline Phosphatase: 152 U/L — ABNORMAL HIGH (ref 38–126)
Anion gap: 17 — ABNORMAL HIGH (ref 5–15)
BUN: 16 mg/dL (ref 8–23)
CO2: 20 mmol/L — ABNORMAL LOW (ref 22–32)
Calcium: 10.1 mg/dL (ref 8.9–10.3)
Chloride: 107 mmol/L (ref 98–111)
Creatinine, Ser: 1.42 mg/dL — ABNORMAL HIGH (ref 0.61–1.24)
GFR, Estimated: 55 mL/min — ABNORMAL LOW (ref >60)
Glucose, Bld: 234 mg/dL — ABNORMAL HIGH (ref 70–99)
Potassium: 2.8 mmol/L — ABNORMAL LOW (ref 3.5–5.1)
Sodium: 144 mmol/L (ref 135–145)
Total Bilirubin: 0.9 mg/dL (ref 0.3–1.2)
Total Protein: 5.7 g/dL — ABNORMAL LOW (ref 6.5–8.1)

## 2021-09-01 LAB — CBG MONITORING, ED: Glucose-Capillary: 154 mg/dL — ABNORMAL HIGH (ref 70–99)

## 2021-09-01 LAB — TROPONIN I (HIGH SENSITIVITY): Troponin I (High Sensitivity): 853 ng/L (ref <18)

## 2021-09-01 MED ORDER — EPINEPHRINE 1 MG/10ML IJ SOSY
PREFILLED_SYRINGE | INTRAMUSCULAR | Status: AC | PRN
  Administered 2021-09-01 (×4): 1 mg via INTRAVENOUS

## 2021-09-01 MED ORDER — SODIUM BICARBONATE 8.4 % IV SOLN
INTRAVENOUS | Status: AC | PRN
  Administered 2021-09-01 (×2): 50 meq via INTRAVENOUS

## 2021-09-01 MED ORDER — LIDOCAINE HCL (CARDIAC) PF 100 MG/5ML IV SOSY
PREFILLED_SYRINGE | INTRAVENOUS | Status: AC | PRN
  Administered 2021-09-01 (×2): 100 mg via INTRAVENOUS

## 2021-09-01 MED ORDER — NOREPINEPHRINE 4 MG/250ML-% IV SOLN: 0.0000 ug/min | INTRAVENOUS | Status: DC

## 2021-09-01 MED ORDER — LIDOCAINE HCL (CARDIAC) PF 100 MG/5ML IV SOSY
PREFILLED_SYRINGE | INTRAVENOUS | Status: AC | PRN
  Administered 2021-09-01: 100 mg via INTRAVENOUS

## 2021-09-01 MED ORDER — CALCIUM CHLORIDE 10 % IV SOLN
INTRAVENOUS | Status: AC | PRN
  Administered 2021-09-01: 1 g via INTRAVENOUS

## 2021-09-01 MED ORDER — NOREPINEPHRINE 4 MG/250ML-% IV SOLN
INTRAVENOUS | Status: AC
  Administered 2021-09-01: 5 ug/min via INTRAVENOUS
  Filled 2021-09-01: qty 250

## 2021-09-01 MED ORDER — EPINEPHRINE 1 MG/10ML IJ SOSY
PREFILLED_SYRINGE | INTRAMUSCULAR | Status: AC | PRN
  Administered 2021-09-01: 1 mg via INTRAVENOUS

## 2021-09-07 NOTE — ED Provider Notes (Signed)
Jack Duffy Provider Note    Event Date/Time   First MD Initiated Contact with Patient 2021/09/30 1428     (approximate)   History   Cardiac arrest   HPI  Jack Duffy is a 66 y.o. male  with history of lumbar surgery who comes in with cardiac arrest.  Patient comes in with EMS for cardiac arrest raise but had at least 45 minutes of cardiac arrest and has been shocked 10-15 times gotten a full load of amiodarone, calcium, bicarb, epinephrine but continues to go in ventricular fibrillation.  Patient did have ET tube placed by EMS.  I was able to talk to family he reports that he was working outside and he came inside and then he started clutching his chest with some chest pressure that he then went out.  The son was there and reports that he lowered him to the ground and started CPR and giving breaths.  He did not hit his head.  Denied any shortness of breath or headaches prior to the event it was only some chest pain.  He had back surgery about 2 months ago but has been up and mobile since then.       Physical Exam   Triage Vital Signs: ED Triage Vitals  Enc Vitals Group     BP 09/30/21 1422 (!) 104/57     Pulse --      Resp 09-30-21 1424 19     Temp --      Temp src --      SpO2 --      Weight --      Height --      Head Circumference --      Peak Flow --      Pain Score --      Pain Loc --      Pain Edu? --      Excl. in [place]? --     Most recent vital signs: Vitals:   09/30/21 1422 09-30-2021 1424  BP: (!) 104/57 (!) 110/95  Resp:  19     General: Patient is obtunded CV:  No pulses noted Resp:  Breath sounds bilaterally with bagging Abd:  No distention.  Other:  Cold pulseless   ED Results / Procedures / Treatments   Labs (all labs ordered are listed, but only abnormal results are displayed) Labs Reviewed  CBG MONITORING, ED - Abnormal; Notable for the following components:      Result Value   Glucose-Capillary 154 (*)    All  other components within normal limits  COMPREHENSIVE METABOLIC PANEL  CBC WITH DIFFERENTIAL/PLATELET  TROPONIN I (HIGH SENSITIVITY)     EKG  My interpretation of EKG:    Patient with asystole without any pulse noted.  RADIOLOGY I have reviewed the xray personally and agree with radiology read with diffuse infiltrates bilaterally.  ET tube in place   PROCEDURES:  Critical Care performed: No  .Critical Care  Performed by: Vanessa [place], MD Authorized by: Vanessa [place], MD   Critical care provider statement:    Critical care time (minutes):  61   Critical care was necessary to treat or prevent imminent or life-threatening deterioration of the following conditions:  Circulatory failure   Critical care was time spent personally by me on the following activities:  Development of treatment plan with patient or surrogate, discussions with consultants, evaluation of patient's response to treatment, examination of patient, ordering and review of laboratory studies,  ordering and review of radiographic studies, ordering and performing treatments and interventions, pulse oximetry, re-evaluation of patient's condition and review of old charts .1-3 Lead EKG Interpretation  Performed by: Vanessa [place], MD Authorized by: Vanessa [place], MD     Interpretation: abnormal     ECG rate:  40   ECG rate assessment: bradycardic     Ectopy: none     Conduction: normal      MEDICATIONS ORDERED IN ED: Medications  EPINEPHrine (ADRENALIN) 1 MG/10ML injection (1 mg Intravenous Given 09-19-21 1412)  norepinephrine (LEVOPHED) '4mg'$  in 281m (0.016 mg/mL) premix infusion (5 mcg/min Intravenous New Bag/Given [id number])     IMPRESSION / MDM / ASSESSMENT AND PLAN / ED COURSE  I reviewed the triage vital signs and the nursing notes.   Patient's presentation is most consistent with acute presentation with potential threat to life or bodily function.   Patient comes in with cardiac arrest.  Patient  was coded for a total of 90 minutes he would have brief return of pulseless and a bradycardia requiring pacing.  H's and T's were all dressed patient was given bicarb, epi, glucose was normal patient had gotten up 1.5 L of fluid with EMS.  Patient had x-ray without pneumothorax.  We considered TNK for heart attack versus PE but patient had already been coded for 45 minutes prior to arrival and had blood coming out of the ET tube therefore there was concern for worsening hemorrhagic change from his lungs if this was given therefore we opted not to give it.  Patient when he did briefly have pulses back we did consult cardiology Dr. ESaunders Revelwas at bedside but agreed that unless patient could have pulses maintained that he would not be a candidate for Cath Lab.  Patient when he briefly had a pulses back was placed on Levophed drip, pacing but then lost pulses again shortly after.  He never maintained pulses for more than a few minutes.  Patient's wife and son were discussed with in the family room and the wife did come back to be a part of resuscitation.  After 90 minutes of coding we discussed that if the next pulse check patient did not have a pulse that we would need to call time of death.  Unfortunately at pulse check patient did not have a pulse this was concern with a Doppler.  I tried to do an ultrasound to look at his heart but there was a lot of shadowing so difficult to get a great view of the heart but Doppler over the femoral artery did not show any pulse and patient was noted to be asystole on the monitor therefore time of death was called at 2:59 PM on 809/13/2023  [facility]. Tried to call PCP due to being weekend    The patient is on the cardiac monitor to evaluate for evidence of arrhythmia and/or significant heart rate changes.      FINAL CLINICAL IMPRESSION(S) / ED DIAGNOSES   Final diagnoses:  Cardiac arrest ([facility]     Rx / DC Orders   ED Discharge  Orders     None        Note:  This document was prepared using Dragon voice recognition software and may include unintentional dictation errors.   FVanessa Cunningham MD 0September 13, [id number]

## 2021-09-07 NOTE — Code Documentation (Signed)
Organ procurement team notified, spoke with Tonye Becket. Reference number: [id number]. Patient is candidate for tissue and eye donation.

## 2021-09-07 NOTE — Code Documentation (Signed)
Pt has IO acces, other RN starting peripheral IV. 1318 was onset of chest compressions.

## 2021-09-07 NOTE — Consult Note (Signed)
Cardiology Consultation   Patient ID: BRITTAN MAPEL MRN: [id number]; DOB: [birth year]  Admit date: Sep 09, 2021 Date of Consult: 09/09/2021  PCP:  Tracie Harrier, [place] Providers Cardiologist:  None       Patient Profile:   GARISON GENOVA is a 66 y.o. male with a hx of lumbar spondylosis s/p L4-S1 laminoforaminotomies who is being seen 09-09-21 for the evaluation of cardiac arrest at the request of Dr. Jari Pigg.  History of Present Illness:   Mr. Matton was reportedly mowing his lawn this morning and began to feel "overheated" per report (patient currently unresponsive).  He then complained of chest pain to his family and felt like he might pass out.  His family was able to lower him to the ground and began CPR when it became evident that he did not have a pulse.  When EMS arrived, he was reportedly in ventricular fibrillation.  He received CPR as well as multiple shocks, amiodarone, and lidocaine.  He reportedly regained a pulse but was quite bradycardic requiring initiation of transcutaneous pacing.  In the ED, he continued to have bradycardia requiring transcutaneous pacing.  He became pulseless again on multiple occasions requiring CPR, epinephrine, and sodium bicarbonate.  Underlying rhythm was PEA with underlying bradycardia.   Past Medical History:  Diagnosis Date   Anemia    Headache    visual distortion migraines   Neuromuscular disorder (HCC)    neuropathy    Past Surgical History:  Procedure Laterality Date   COLONOSCOPY WITH PROPOFOL N/A 03/24/2018   Procedure: COLONOSCOPY WITH PROPOFOL;  Surgeon: Lollie Sails, MD;  Location: [facility] ENDOSCOPY;  Service: Endoscopy;  Laterality: N/A;   KNEE ARTHROPLASTY     when in college, was not a replacement   LUMBAR LAMINECTOMY/DECOMPRESSION MICRODISCECTOMY Right 07/16/2021   Procedure: RIGHT L4-S1 LAMINOFORAMINOTOMIES;  Surgeon: Meade Maw, MD;  Location: [facility] ORS;  Service: Neurosurgery;  Laterality:  Right;   TONSILLECTOMY     WISDOM TOOTH EXTRACTION       Home Medications:  Prior to Admission medications   Medication Sig Start Date Mandela Bello Date Taking? Authorizing Provider  Ascorbic Acid (VITAMIN C) 1000 MG tablet Take 1,000 mg by mouth daily.    [provider]  Cholecalciferol (VITAMIN D3) 50 MCG (2000 UT) TABS Take 1 tablet by mouth 3 (three) times a week.    [provider]  clobetasol (TEMOVATE) 0.05 % GEL Apply 1 Application topically as needed.    [provider]  ferrous sulfate 325 (65 FE) MG tablet Take 325 mg by mouth daily with breakfast.    [provider]  gabapentin (NEURONTIN) 300 MG capsule Take 300 mg by mouth at bedtime.    [provider]  Multiple Vitamin (MULTIVITAMIN) tablet Take 1 tablet by mouth daily.    [provider]  Omega-3 Fatty Acids (FISH OIL) 1000 MG CAPS Take 1 capsule by mouth daily.    [provider]  omeprazole (PRILOSEC) 20 MG capsule Take 20 mg by mouth daily.    [provider]    Inpatient Medications: Scheduled Meds:  Continuous Infusions:  norepinephrine (LEVOPHED) Adult infusion 5 mcg/min (09-09-2021 1427)   PRN Meds: EPINEPHrine  Allergies:    Allergies  Allergen Reactions   Terbinafine Rash and Other (See Comments)    Social History:   Unable to obtain as the patient is unresponsive.   Family History:   Unable to obtain as the patient is unresponsive.  ROS:  Unable to obtain as the patient is unresponsive.  Physical Exam/Data:   Vitals:   September 22, 2021 1422 September 22, 2021 1424  BP: (!) 104/57 (!) 110/95  Resp:  19   No intake or output data in the 24 hours ending 09-22-21 1501    08/28/2021   10:25 AM 07/16/2021    6:20 AM 07/17/2019    8:29 PM  Last 3 Weights  Weight (lbs) 182 lb 6.4 oz 185 lb 176 lb 8 oz  Weight (kg) 82.736 kg 83.915 kg 80.06 kg     There is no height or weight on file to calculate BMI.  General:  Intubated man receiving CPR via Lucas  device HEENT: ETT in place.  Blood noted in ETT and eminated from the patient's nose. Vascular: Pulse not palpable. Cardiac:  Unable to assess due to ongoing CPR. Lungs:  Unable to assess due to ongoing CPR. Abd: soft, nontender, no hepatomegaly  Ext: no edema Musculoskeletal:  No deformities Skin: cool and pale Neuro:  Unresponsive.  Myoclonic jerks intermittently noted Psych:  Unable to obtain  EKG:  The EKG was personally reviewed and demonstrates:  Wide complex rhythm most consistent with junctional rhythm with IVCD. Telemetry:  Telemetry was personally reviewed and demonstrates:  Junctional rhythm versus atrial fibrillation with slow-ventricular response and wide complexes.  Relevant CV Studies: None  Laboratory Data:  High Sensitivity Troponin:  No results for input(s): "TROPONINIHS" in the last 720 hours.   ChemistryNo results for input(s): "NA", "K", "CL", "CO2", "GLUCOSE", "BUN", "CREATININE", "CALCIUM", "MG", "GFRNONAA", "GFRAA", "ANIONGAP" in the last 168 hours.  No results for input(s): "PROT", "ALBUMIN", "AST", "ALT", "ALKPHOS", "BILITOT" in the last 168 hours. Lipids No results for input(s): "CHOL", "TRIG", "HDL", "LABVLDL", "LDLCALC", "CHOLHDL" in the last 168 hours.  HematologyNo results for input(s): "WBC", "RBC", "HGB", "HCT", "MCV", "MCH", "MCHC", "RDW", "PLT" in the last 168 hours. Thyroid No results for input(s): "TSH", "FREET4" in the last 168 hours.  BNPNo results for input(s): "BNP", "PROBNP" in the last 168 hours.  DDimer No results for input(s): "DDIMER" in the last 168 hours.   Radiology/Studies:  DG Chest Port 1 View  Result Date: Sep 22, 2021 CLINICAL DATA:  Status post cardiac arrest EXAM: PORTABLE CHEST 1 VIEW COMPARISON:  None Available. FINDINGS: Transverse diameter of heart is within normal limits. Diffuse alveolar densities are seen in both lungs. There is no significant pleural effusion or pneumothorax. Tip of endotracheal tube is 6.9 cm above the  carina. IMPRESSION: Diffuse earlier densities in both lungs suggest pulmonary edema. Possibility of underlying pneumonia is not excluded. Electronically Signed   By: Elmer Picker M.D.   On: September 22, 2021 14:51     Assessment and Plan:   Cardiac arrest: Initial rhythm was reportedly ventricular fibrillation followed by PEA with transient return of pulse.  DDx includes acute MI, though EKG does not clearly meet STEMI criteria.  Given spine surgery last month, massive PE is also a consideration.  EKG also worrisome for marked metabolic abnormalities.  Unfortunately, pulses could not be maintained for more than a couple of minutes, precluding safe transport to CT and/or cardiac cath lab.  Additionally, prolonged resuscitation (~1.5 hours) and myoclonic activity suggest poor neurologic prognosis.  Pulse could not be obtain again despite additional CPR and epinephrine boluses with final rhythm strip demonstrating asystole with rare agonal beats.  I agree with Dr. Jari Pigg that further resuscitation attempts would be futile.  Patient's family was at the bedside when the patient passed away.  For questions or updates,  please contact [place] Please consult [email or web address] for contact info under [facility].  Signed, Nelva Bush, MD  2021-09-09 3:01 PM

## 2021-09-07 NOTE — ED Triage Notes (Signed)
Pt to ED from home witnessed cardiac arrest, shocked 3 times, the ROSC, then paced and then pulse lost again on lucas at this time, VT was last rhythm check, other checks were v fib  Blood on face and neck High 30s ETCO@

## 2021-09-07 NOTE — ED Notes (Signed)
Pt has received 5 doses epi, amio, bicarb, 2g mag, calcium. Primary RN will document more specifically on dosages.

## 2021-09-07 NOTE — ED Notes (Signed)
Primary RN will continue to chart. Pt was just shocked.

## 2021-09-07 NOTE — Progress Notes (Signed)
Chaplain responded to death. Made initial contact with family, who states that their pastor is en route.  Provided hospitality and brief emotional support.    Please contact as needed for further support.    Minus Liberty, [place]  Pager:  [phone]    09/17/2021 1500  Clinical Encounter Type  Visited With Family  Visit Type Death;Initial  Referral From Nurse  Consult/Referral To Chaplain  Stress Factors  Family Stress Factors Loss;Major life changes

## 2021-09-07 NOTE — ED Notes (Signed)
Pt's PCP MD Hande informed of pt's death.

## 2021-09-07 DEATH — deceased

## 2021-09-25 IMAGING — CT CT ABD-PELV W/ CM
1 of 3 series · 14 of 32 positions shown, 19 images · IV contrast (APPLIED)
Comparison: None.

CLINICAL DATA: Upper abdominal pain and diarrhea for 4 months. The
patient reports a 15 pound weight loss.

EXAM:
CT ABDOMEN AND PELVIS WITH CONTRAST
TECHNIQUE: Multidetector CT imaging of the abdomen and pelvis was performed
using the standard protocol following bolus administration of
intravenous contrast.
CONTRAST:  100 mL OMNIPAQUE IOHEXOL 300 MG/ML  SOLN

[Series 2: axial st · axial · 0.74mm/px · z∈[-1156,-756]mm · 14 of 92 slices shown, 19 images]
[im 6/92  soft-tissue]
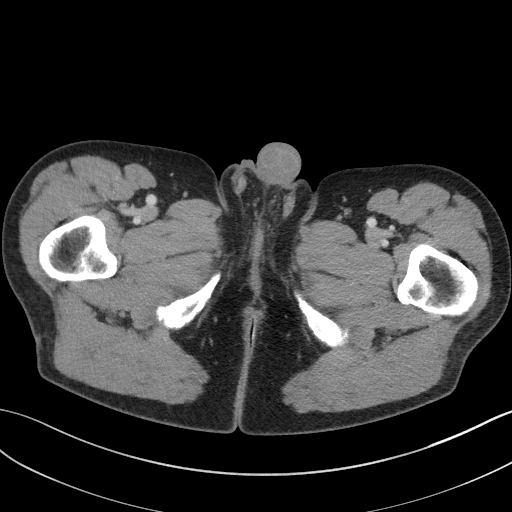
[im 6/92  bone]
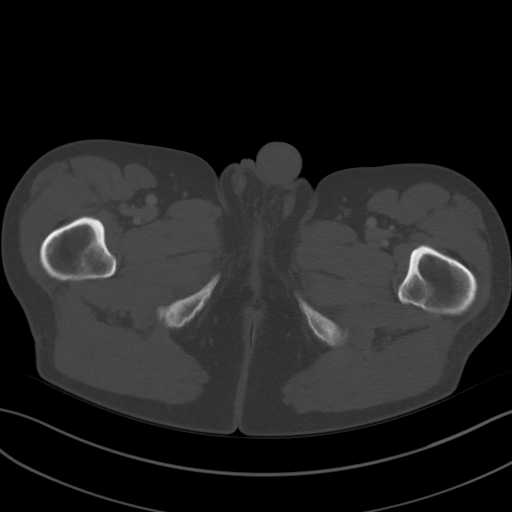
[im 11/92  soft-tissue]
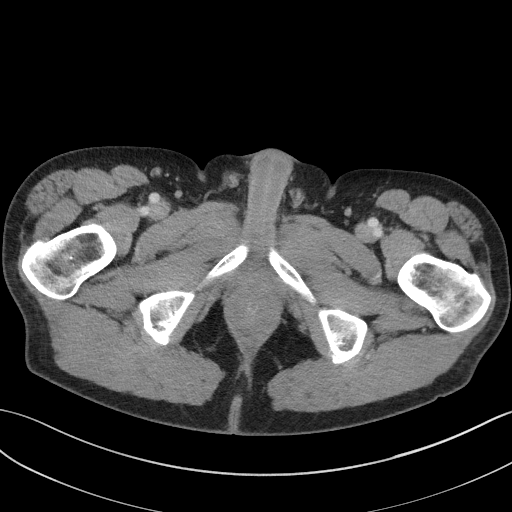
[im 21/92  soft-tissue]
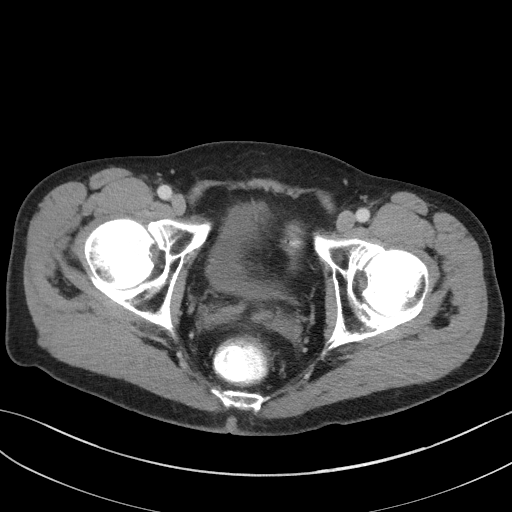
[im 26/92  soft-tissue]
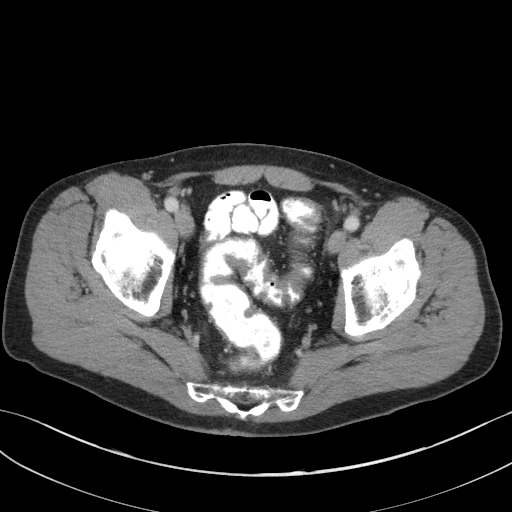
[im 31/92  soft-tissue]
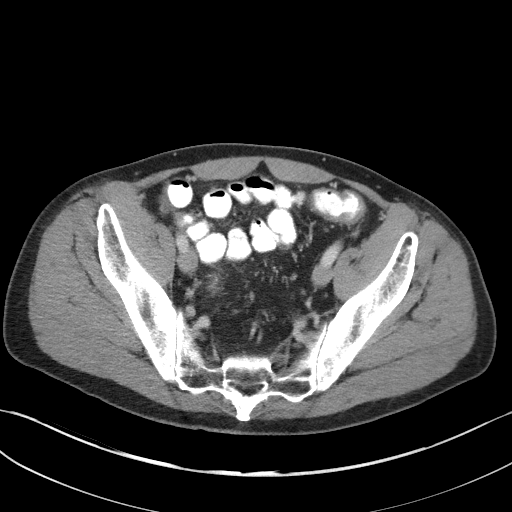
[im 41/92  soft-tissue]
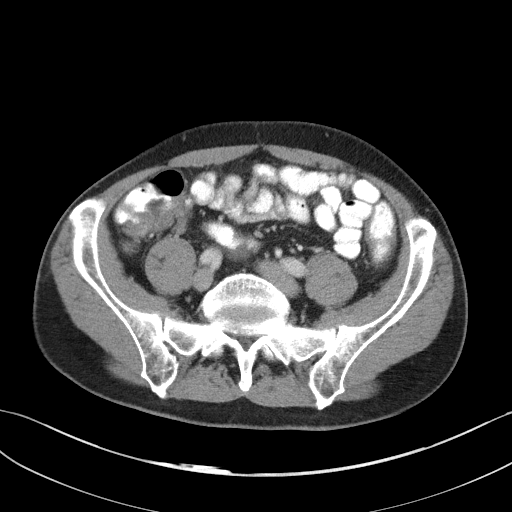
[im 46/92  soft-tissue]
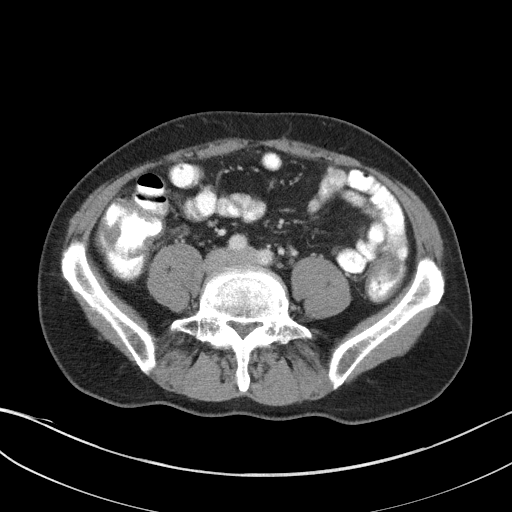
[im 51/92  soft-tissue]
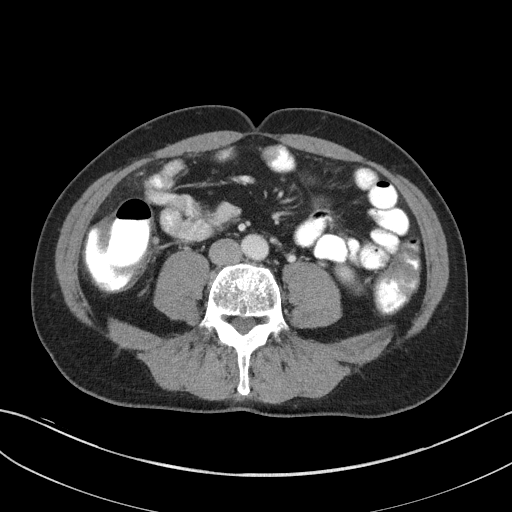
[im 61/92  soft-tissue]
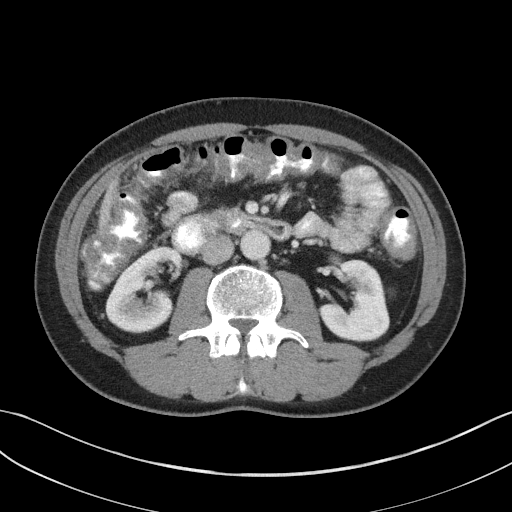
[im 61/92  bone]
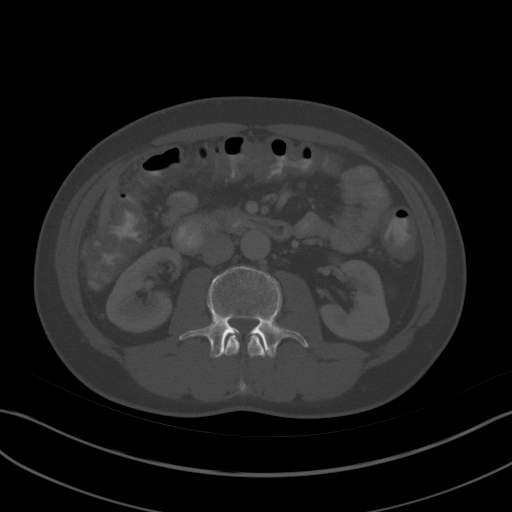
[im 66/92  soft-tissue]
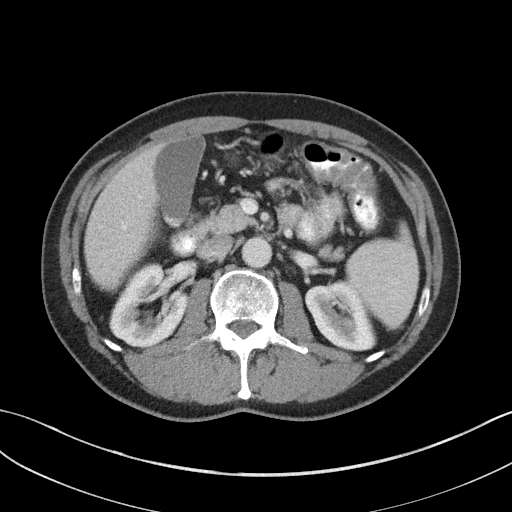
[im 71/92  soft-tissue]
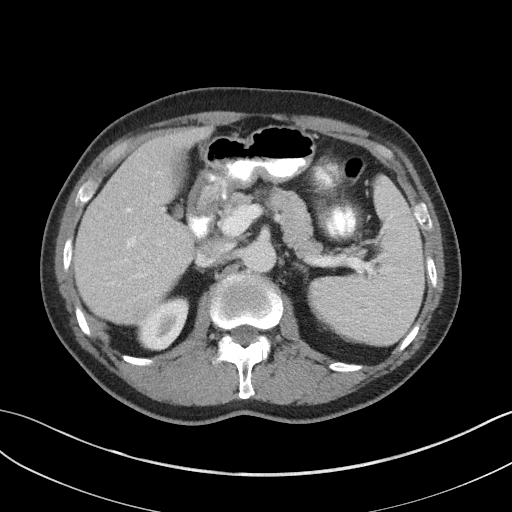
[im 71/92  lung]
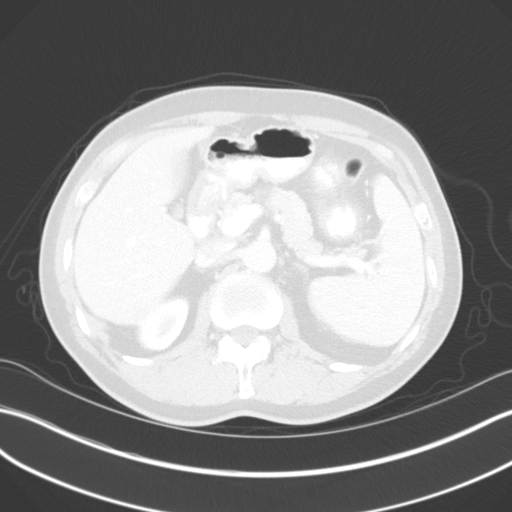
[im 76/92  lung]
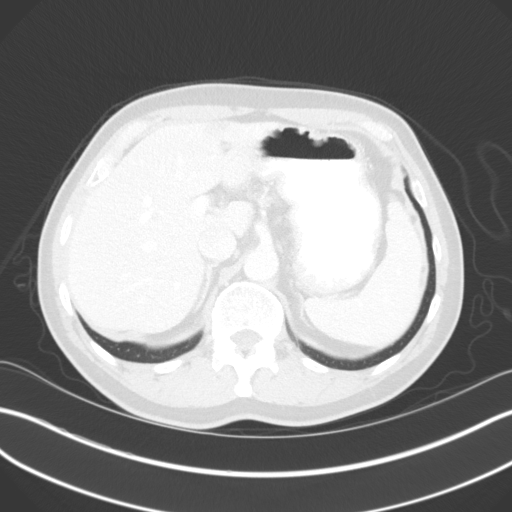
[im 81/92  soft-tissue]
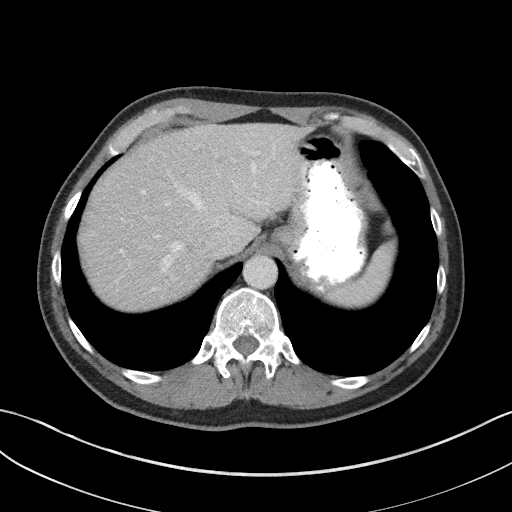
[im 81/92  lung]
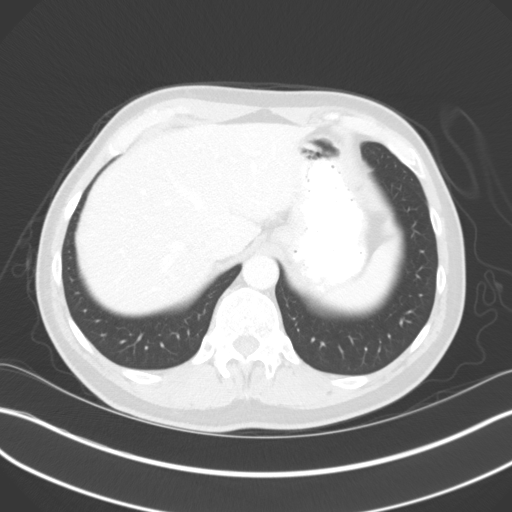
[im 86/92  soft-tissue]
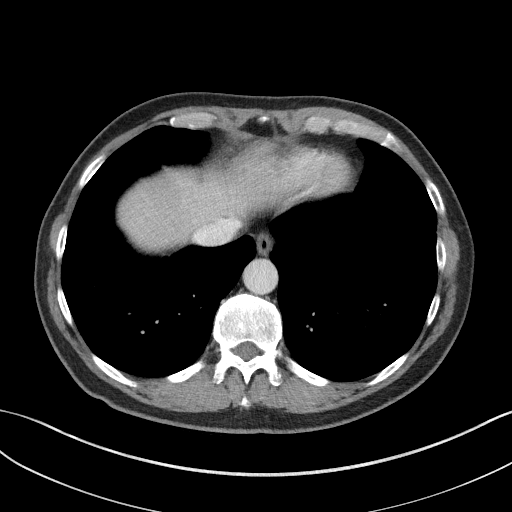
[im 86/92  lung]
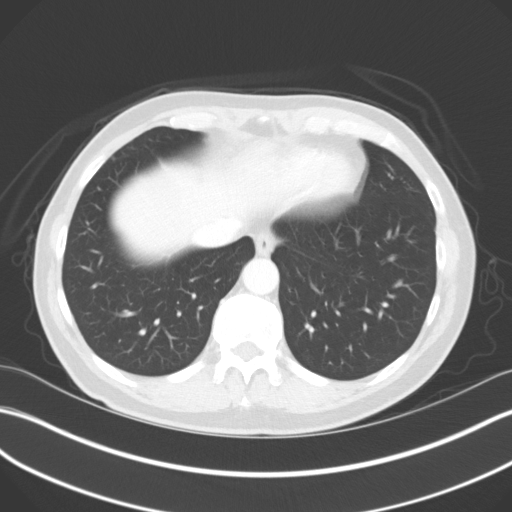

[14 of 32 positions shown; findings below may reference images not displayed]

FINDINGS: Lower chest: Lung bases are clear. No pleural or pericardial
effusion. Heart size is normal.

Hepatobiliary: A few tiny stones are seen in the gallbladder but
there is no CT evidence of cholecystitis. The liver and biliary tree
are unremarkable.

Pancreas: Unremarkable. No pancreatic ductal dilatation or
surrounding inflammatory changes.

Spleen: Normal in size without focal abnormality.

Adrenals/Urinary Tract: Adrenal glands are unremarkable. Kidneys are
normal, without renal calculi, focal lesion, or hydronephrosis.
Bladder is unremarkable.

Stomach/Bowel: The colon is incompletely distended but its walls
appear thickened throughout. The stomach, small bowel and appendix
are unremarkable. There is no pneumatosis or portal venous gas.

Vascular/Lymphatic: No significant vascular findings are present. No
enlarged abdominal or pelvic lymph nodes.

Reproductive: Prostate is unremarkable.

Other: None.

Musculoskeletal: No acute or significant osseous findings.
IMPRESSION: The colon is incompletely distended but its walls appear thickened
throughout worrisome for infectious or inflammatory colitis.

Small gallstones without evidence of cholecystitis.

## 2021-10-02 ENCOUNTER — Encounter: Payer: 59 | Admitting: Neurosurgery
